# Patient Record
Sex: Male | Born: 1995 | Race: Black or African American | Hispanic: No | Marital: Single | Smoking: Never smoker
Health system: Southern US, Community
[De-identification: ages and names within clinical notes are randomized; demographics above are authoritative.]

---
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
## 2014-10-06 ENCOUNTER — Emergency Department (HOSPITAL_COMMUNITY)
Admission: EM | Admit: 2014-10-06 | Discharge: 2014-10-06 | Disposition: A | Discharge status: Home / Self Care | Payer: 59 | Attending: Emergency Medicine | Admitting: Emergency Medicine

## 2014-10-06 ENCOUNTER — Encounter (HOSPITAL_COMMUNITY): Payer: Self-pay | Admitting: Emergency Medicine

## 2014-10-06 ENCOUNTER — Emergency Department (HOSPITAL_COMMUNITY): Payer: 59

## 2014-10-06 DIAGNOSIS — R42 Dizziness and giddiness: Secondary | ICD-10-CM | POA: Diagnosis not present

## 2014-10-06 DIAGNOSIS — R51 Headache: Secondary | ICD-10-CM | POA: Insufficient documentation

## 2014-10-06 DIAGNOSIS — R519 Headache, unspecified: Secondary | ICD-10-CM

## 2014-10-06 MED ORDER — SODIUM CHLORIDE 0.9 % IV BOLUS (SEPSIS)
1000.0000 mL | Freq: Once | INTRAVENOUS | Status: AC
Start: 2014-10-06 — End: 2014-10-06
  Administered 2014-10-06: 1000 mL via INTRAVENOUS

## 2014-10-06 MED ORDER — DIPHENHYDRAMINE HCL 50 MG/ML IJ SOLN
25.0000 mg | Freq: Once | INTRAMUSCULAR | Status: AC
  Administered 2014-10-06: 25 mg via INTRAVENOUS
  Filled 2014-10-06: qty 1

## 2014-10-06 MED ORDER — METOCLOPRAMIDE HCL 5 MG/ML IJ SOLN
10.0000 mg | Freq: Once | INTRAMUSCULAR | Status: AC
  Administered 2014-10-06: 10 mg via INTRAVENOUS
  Filled 2014-10-06: qty 2

## 2014-10-06 MED ORDER — KETOROLAC TROMETHAMINE 30 MG/ML IJ SOLN
30.0000 mg | Freq: Once | INTRAMUSCULAR | Status: AC
  Administered 2014-10-06: 30 mg via INTRAVENOUS
  Filled 2014-10-06: qty 1

## 2014-10-06 NOTE — ED Notes (Signed)
IV removed from rt ac, 2x2 gauze applied with tape.

## 2014-10-06 NOTE — ED Notes (Signed)
Patient complaining of headache since Sunday.  The headache started in the occipital and has moved to the anterior.  Denies visual and light sensitivity.

## 2014-10-06 NOTE — Discharge Instructions (Signed)

## 2014-10-06 NOTE — ED Notes (Signed)
Family at bedside. 

## 2014-10-06 NOTE — ED Provider Notes (Signed)
CSN: [id number]     Arrival date & time 10/06/14  0907 History   First MD Initiated Contact with Patient 10/06/14 0911     Chief Complaint  Patient presents with  . Headache     (Consider location/radiation/quality/duration/timing/severity/associated sxs/prior Treatment) HPI Comments: Patient presents to emergency department with chief complaint of headache. He states that he has had intermittent headaches for the past 3 years. He states that the headaches are severe. He states that this current headache started on Sunday. He states that it starts slowly and has progressively worsened. States that started in the left occipital region and has moved anteriorly. He denies any vision changes. Denies any weakness or numbness of the extremities. Denies any slurred speech. He states that he has felt dizzy from time to time. He has tried taking OTC medications with no relief. He denies any fevers, chills, or neck stiffness. He reports being able to ambulate appropriately.  Of note, patient has family history of brain cancer.  The history is provided by the patient. No language interpreter was used.    History reviewed. No pertinent past medical history. History reviewed. No pertinent past surgical history. No family history on file. History  Substance Use Topics  . Smoking status: Never Smoker   . Smokeless tobacco: Not on file  . Alcohol Use: No    Review of Systems  Constitutional: Negative for fever and chills.  Respiratory: Negative for shortness of breath.   Cardiovascular: Negative for chest pain.  Gastrointestinal: Negative for nausea, vomiting, diarrhea and constipation.  Genitourinary: Negative for dysuria.  Neurological: Positive for headaches.  All other systems reviewed and are negative.     Allergies  Review of patient's allergies indicates no known allergies.  Home Medications   Prior to Admission medications   Not on File   BP 138/84 mmHg  Pulse 76  Temp(Src) 98  F (36.7 C) (Oral)  Resp 11  Ht 5\' 11"  (1.803 m)  Wt 220 lb (99.791 kg)  BMI 30.70 kg/m2  SpO2 99% Physical Exam  Constitutional: He is oriented to person, place, and time. He appears well-developed and well-nourished.  HENT:  Head: Normocephalic and atraumatic.  Right Ear: External ear normal.  Left Ear: External ear normal.  Eyes: Conjunctivae and EOM are normal. Pupils are equal, round, and reactive to light.  Neck: Normal range of motion. Neck supple.  No pain with neck flexion, no meningismus  Cardiovascular: Normal rate, regular rhythm and normal heart sounds.  Exam reveals no gallop and no friction rub.   No murmur heard. Pulmonary/Chest: Effort normal and breath sounds normal. No respiratory distress. He has no wheezes. He has no rales. He exhibits no tenderness.  Abdominal: Soft. He exhibits no distension and no mass. There is no tenderness. There is no rebound and no guarding.  Musculoskeletal: Normal range of motion. He exhibits no edema or tenderness.  Normal gait.  Neurological: He is alert and oriented to person, place, and time. He has normal reflexes.  CN 3-12 intact, normal finger to nose, no pronator drift, sensation and strength intact bilaterally.  Skin: Skin is warm and dry.  Psychiatric: He has a normal mood and affect. His behavior is normal. Judgment and thought content normal.  Nursing note and vitals reviewed.   ED Course  Procedures (including critical care time) Labs Review Labs Reviewed - No data to display  Imaging Review Ct Head Wo Contrast  10/06/2014   CLINICAL DATA:  Headache.  EXAM: CT HEAD WITHOUT  CONTRAST  TECHNIQUE: Contiguous axial images were obtained from the base of the skull through the vertex without intravenous contrast.  COMPARISON:  None.  FINDINGS: Bony calvarium is intact. No mass effect or midline shift is noted. Ventricular size is within normal limits. There is no evidence of mass lesion, hemorrhage or acute infarction.   IMPRESSION: Normal head CT.   Electronically Signed   By: Roque LiasJames  Green M.D.   On: 10/06/2014 10:35     EKG Interpretation None      MDM   Final diagnoses:  Headache    Patient with three-year history of intermittent headaches. He has never had a workup done. States that one time he is scheduled to have a CT scan because brain cancer runs in his family, however he became nervous never got the test done. States that he would like to have tests done today. I will order a CT scan for this patient as well as treat his symptoms. If CT is negative, will discharge to home and recommend neurology follow-up.  Pt HA treated and improved while in ED.  Presentation is like pts typical HA and non [facility], Meningitis, or temporal arteritis. Pt is afebrile with no focal neuro deficits, nuchal rigidity, or change in vision. Pt is to follow up with neurology to discuss prophylactic medication. Pt verbalizes understanding and is agreeable with plan to dc.      Roxy HorsemanRobert Yanky Vanderburg, PA-C 10/06/14 1053  Joya Gaskinsonald W Wickline, MD 10/06/14 1255

## 2015-09-24 IMAGING — CT CT HEAD W/O CM
1 series · 16 of 30 positions shown, 20 images · non-contrast
Comparison: None.

CLINICAL DATA: Headache.

EXAM:
CT HEAD WITHOUT CONTRAST
TECHNIQUE: Contiguous axial images were obtained from the base of the skull
through the vertex without intravenous contrast.

[Series 2: head 5.0 h30s · axial · 0.45mm/px · z∈[-183,-33]mm · 16 of 34 slices shown, 20 images]
[im 2/34  brain]
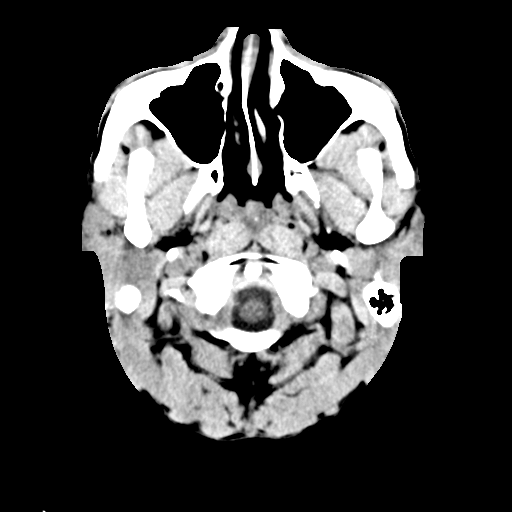
[im 2/34  bone]
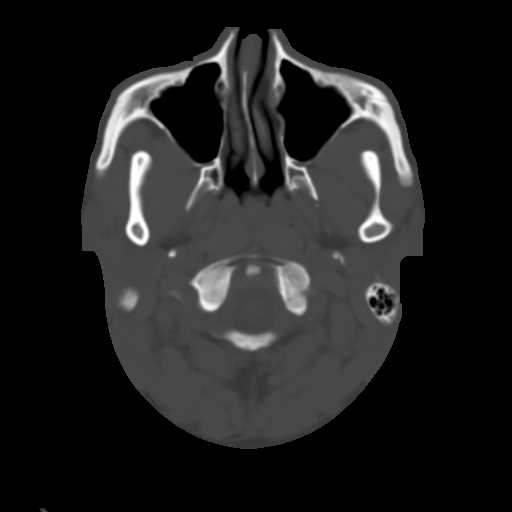
[im 4/34  brain]
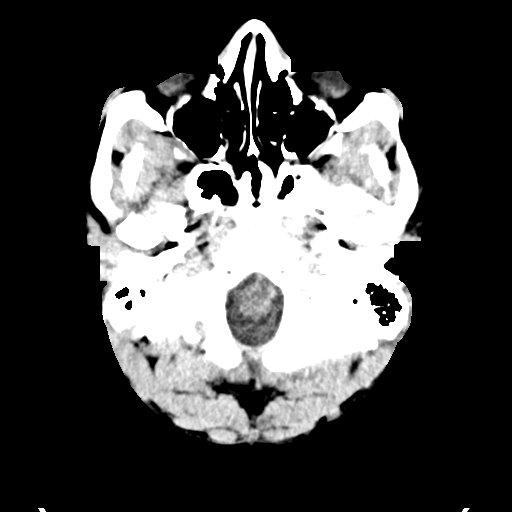
[im 6/34  brain]
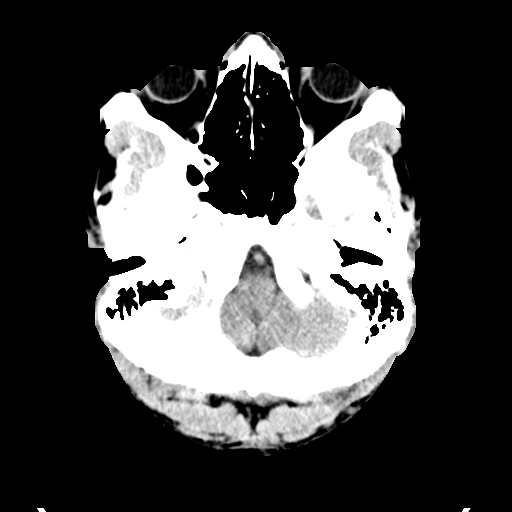
[im 8/34  brain]
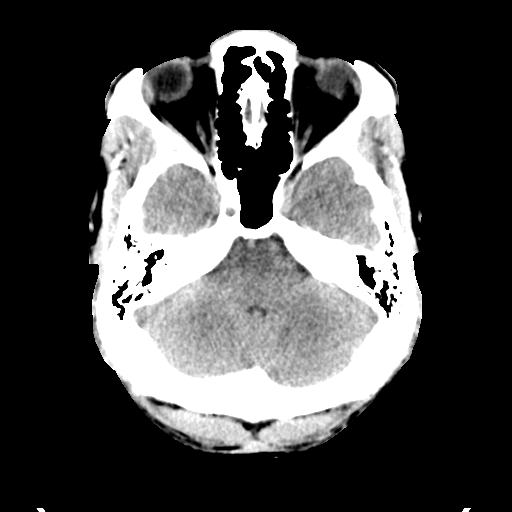
[im 10/34  brain]
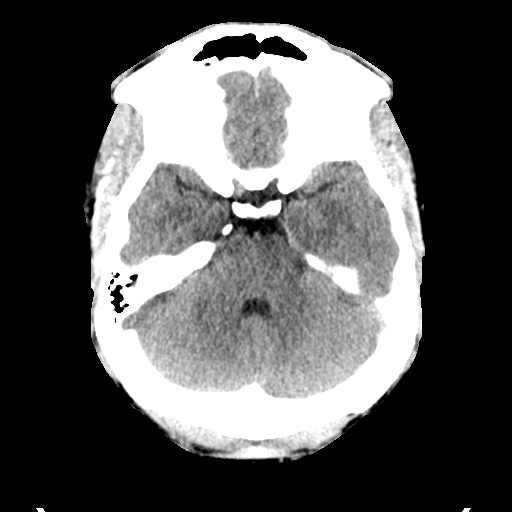
[im 10/34  bone]
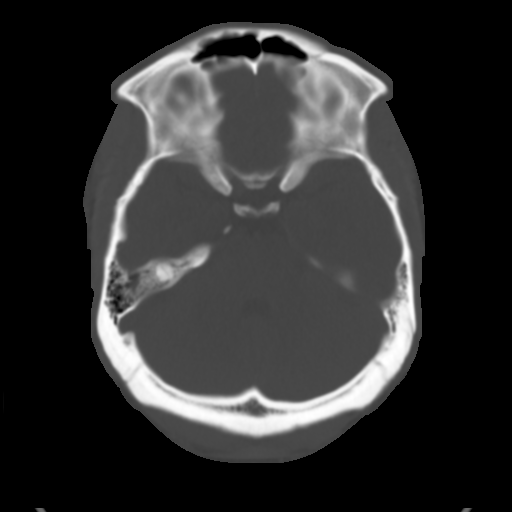
[im 12/34  brain]
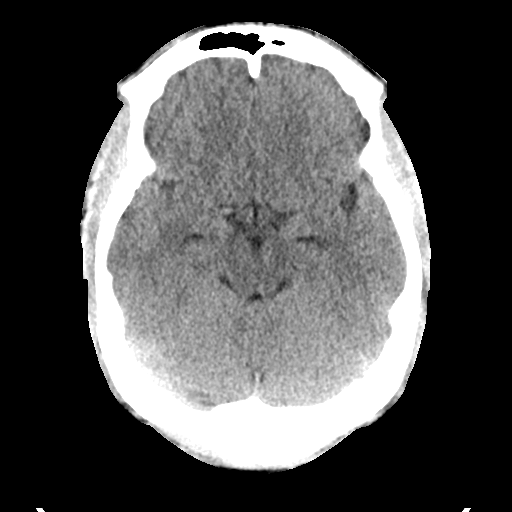
[im 14/34  brain]
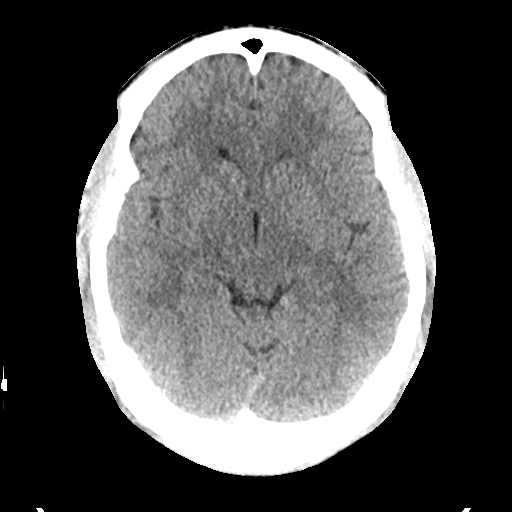
[im 16/34  brain]
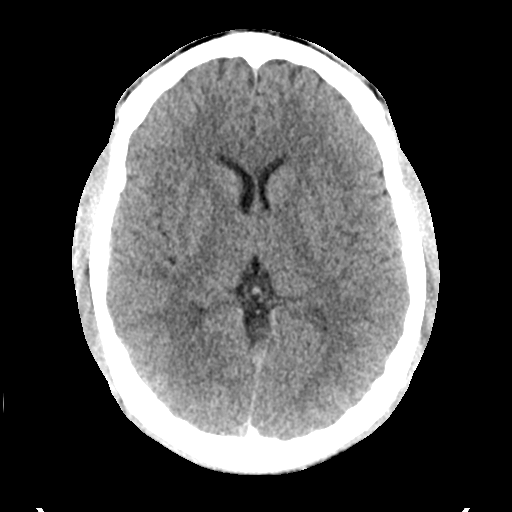
[im 18/34  brain]
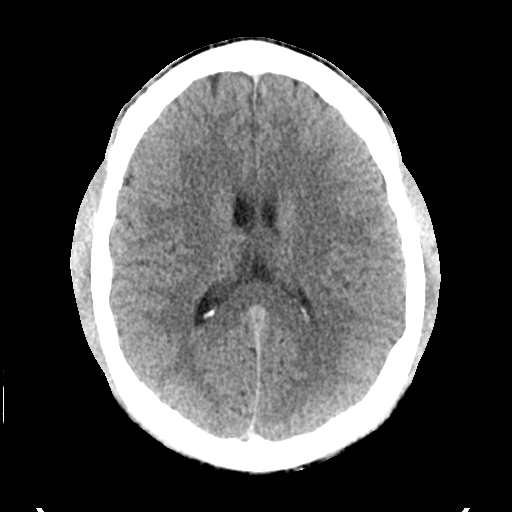
[im 18/34  bone]
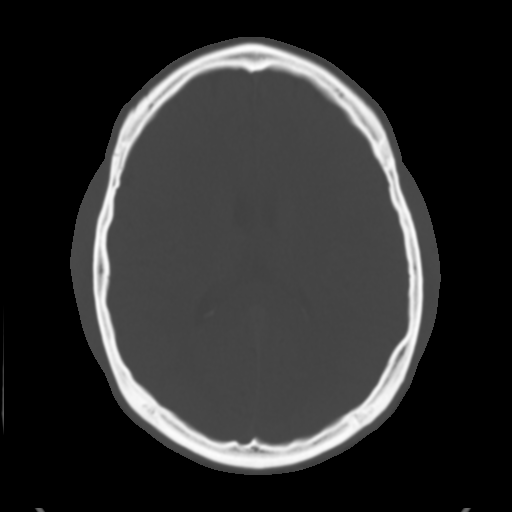
[im 20/34  brain]
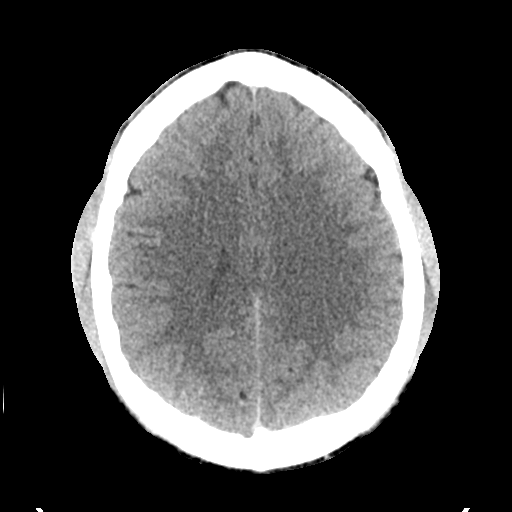
[im 22/34  brain]
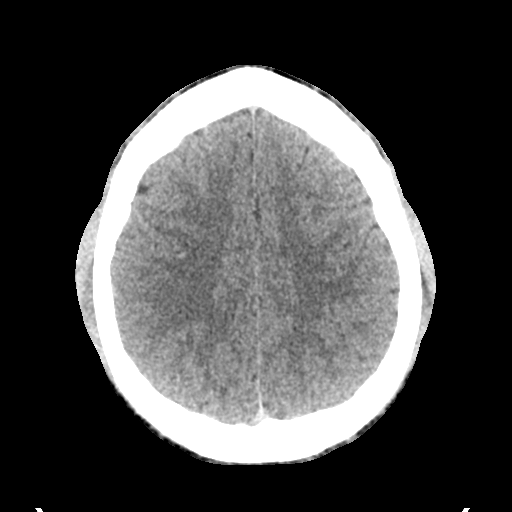
[im 24/34  brain]
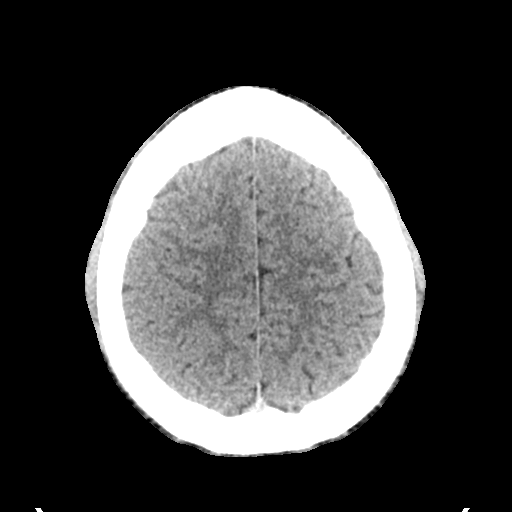
[im 26/34  brain]
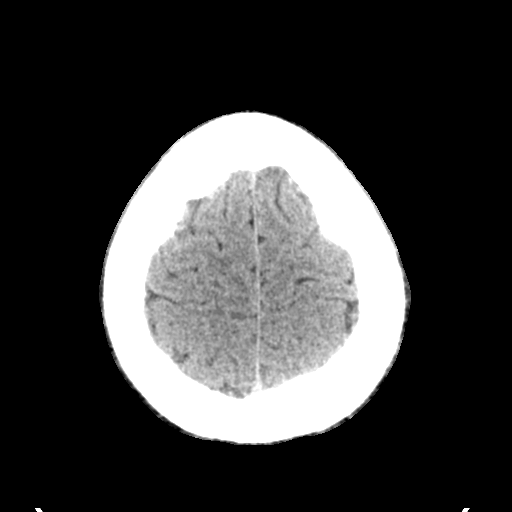
[im 26/34  bone]
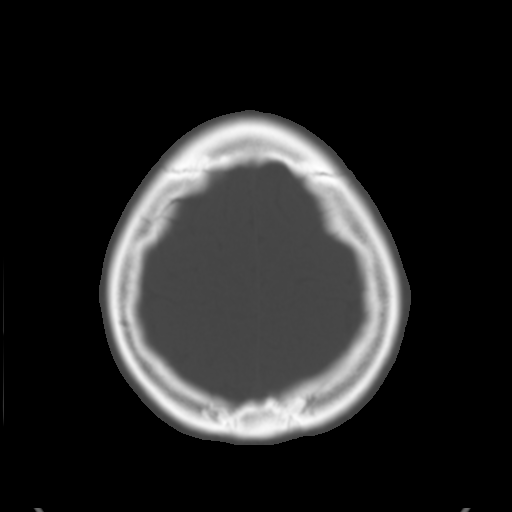
[im 28/34  brain]
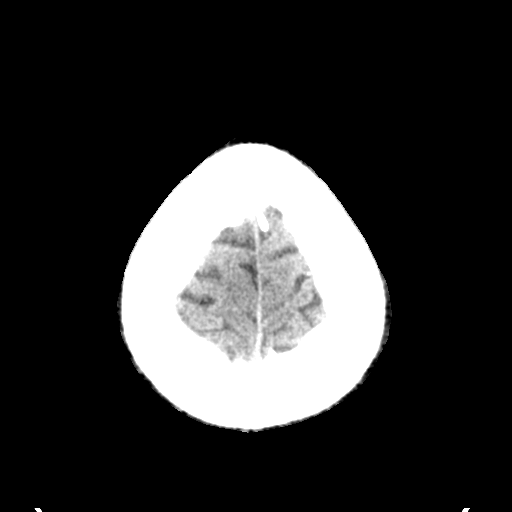
[im 30/34  brain]
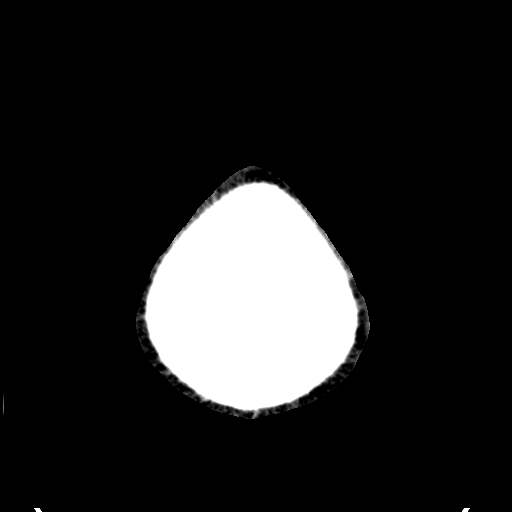
[im 32/34  brain]
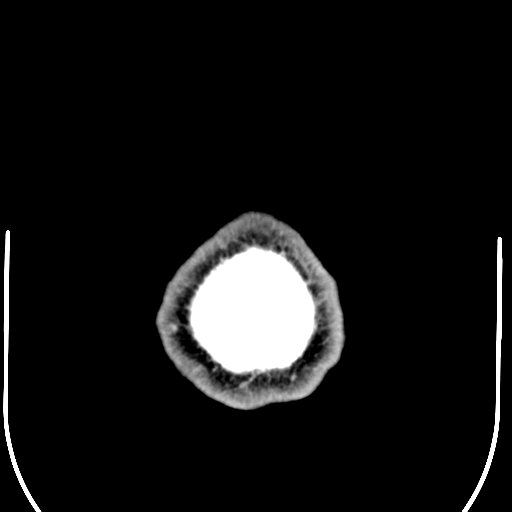

[16 of 30 positions shown; findings below may reference images not displayed]

FINDINGS: Bony calvarium is intact. No mass effect or midline shift is noted.
Ventricular size is within normal limits. There is no evidence of
mass lesion, hemorrhage or acute infarction.
IMPRESSION: Normal head CT.

## 2016-01-15 ENCOUNTER — Emergency Department (HOSPITAL_COMMUNITY): Payer: Medicaid Other

## 2016-01-15 ENCOUNTER — Emergency Department (HOSPITAL_COMMUNITY)
Admission: EM | Admit: 2016-01-15 | Discharge: 2016-01-15 | Disposition: A | Discharge status: Home / Self Care | Payer: Medicaid Other | Attending: Emergency Medicine | Admitting: Emergency Medicine

## 2016-01-15 ENCOUNTER — Encounter (HOSPITAL_COMMUNITY): Payer: Self-pay | Admitting: Emergency Medicine

## 2016-01-15 DIAGNOSIS — L989 Disorder of the skin and subcutaneous tissue, unspecified: Secondary | ICD-10-CM | POA: Diagnosis present

## 2016-01-15 DIAGNOSIS — L03113 Cellulitis of right upper limb: Secondary | ICD-10-CM | POA: Diagnosis not present

## 2016-01-15 DIAGNOSIS — R21 Rash and other nonspecific skin eruption: Secondary | ICD-10-CM

## 2016-01-15 MED ORDER — CEPHALEXIN 500 MG PO CAPS: 500.0000 mg | ORAL_CAPSULE | Freq: Four times a day (QID) | ORAL | Status: DC

## 2016-01-15 MED ORDER — CEPHALEXIN 500 MG PO CAPS
500.0000 mg | ORAL_CAPSULE | Freq: Once | ORAL | Status: AC
  Administered 2016-01-15: 500 mg via ORAL
  Filled 2016-01-15: qty 1

## 2016-01-15 MED ORDER — SULFAMETHOXAZOLE-TRIMETHOPRIM 800-160 MG PO TABS: 1.0000 | ORAL_TABLET | Freq: Two times a day (BID) | ORAL | Status: DC

## 2016-01-15 MED ORDER — SULFAMETHOXAZOLE-TRIMETHOPRIM 800-160 MG PO TABS
1.0000 | ORAL_TABLET | Freq: Once | ORAL | Status: AC
  Administered 2016-01-15: 1 via ORAL
  Filled 2016-01-15: qty 1

## 2016-01-15 NOTE — ED Notes (Signed)
Pt noticed earlier a raised area near his R axilla that was itchy that is now red, raised and spreading. Alert and oriented.

## 2016-01-15 NOTE — Discharge Instructions (Signed)
We think you have cellulitis. Please take the antibiotics. Return to the ER immediately if the rash is spreading, there is increased pain/swelling, pus drainage.   Cellulitis Cellulitis is an infection of the skin and the tissue beneath it. The infected area is usually red and tender. Cellulitis occurs most often in the arms and lower legs.  CAUSES  Cellulitis is caused by bacteria that enter the skin through cracks or cuts in the skin. The most common types of bacteria that cause cellulitis are staphylococci and streptococci. SIGNS AND SYMPTOMS   Redness and warmth.  Swelling.  Tenderness or pain.  Fever. DIAGNOSIS  Your health care provider can usually determine what is wrong based on a physical exam. Blood tests may also be done. TREATMENT  Treatment usually involves taking an antibiotic medicine. HOME CARE INSTRUCTIONS   Take your antibiotic medicine as directed by your health care provider. Finish the antibiotic even if you start to feel better.  Keep the infected arm or leg elevated to reduce swelling.  Apply a warm cloth to the affected area up to 4 times per day to relieve pain.  Take medicines only as directed by your health care provider.  Keep all follow-up visits as directed by your health care provider. SEEK MEDICAL CARE IF:   You notice red streaks coming from the infected area.  Your red area gets larger or turns dark in color.  Your bone or joint underneath the infected area becomes painful after the skin has healed.  Your infection returns in the same area or another area.  You notice a swollen bump in the infected area.  You develop new symptoms.  You have a fever. SEEK IMMEDIATE MEDICAL CARE IF:   You feel very sleepy.  You develop vomiting or diarrhea.  You have a general ill feeling (malaise) with muscle aches and pains.   This information is not intended to replace advice given to you by your health care provider. Make sure you discuss any  questions you have with your health care provider.   Document Released: 07/23/2005 Document Revised: 07/04/2015 Document Reviewed: 12/29/2011 Elsevier Interactive Patient Education [employer]2016 [employer]

## 2016-01-15 NOTE — ED Provider Notes (Signed)
CSN: [id number]     Arrival date & time 01/15/16  0209 History  By signing my name below, I, Tanda RockersMargaux Venter, attest that this documentation has been prepared under the direction and in the presence of Derwood KaplanAnkit Priest Lockridge, MD. Electronically Signed: Tanda RockersMargaux Venter, ED Scribe. 01/15/2016. 4:45 AM.   Chief Complaint  Patient presents with  . Skin Problem   The history is provided by the patient and a relative. No language interpreter was used.     HPI Comments: Micheal Lara is a 20 y.o. male who presents to the Emergency Department complaining of gradual onset, constant, mild, burning sensation underneath right arm that began yesterday. Pt reports that yesterday he felt a bump to his right axilla. A few hours later he began feeling a burning sensation to his right arm and noticed redness to a large portion of the right upper arm. He denies any pain to the area. No other associated symptoms.   History reviewed. No pertinent past medical history. History reviewed. No pertinent past surgical history. History reviewed. No pertinent family history. Social History  Substance Use Topics  . Smoking status: Never Smoker   . Smokeless tobacco: None  . Alcohol Use: No    Review of Systems  A complete 10 system review of systems was obtained and all systems are negative except as noted in the HPI and PMH.   Allergies  Review of patient's allergies indicates no known allergies.  Home Medications   Prior to Admission medications   Medication Sig Start Date End Date Taking? Authorizing Provider  ibuprofen (ADVIL,MOTRIN) 200 MG tablet Take 400-600 mg by mouth every 6 (six) hours as needed for headache or mild pain.   Yes Historical Provider, MD  naproxen sodium (ANAPROX) 220 MG tablet Take 440 mg by mouth 2 (two) times daily as needed (for pain and headache). Reported on 01/15/2016   Yes Historical Provider, MD  cephALEXin (KEFLEX) 500 MG capsule Take 1 capsule (500 mg total) by mouth 4 (four) times  daily. 01/15/16   Derwood KaplanAnkit Parthenia Tellefsen, MD  sulfamethoxazole-trimethoprim (BACTRIM DS,SEPTRA DS) 800-160 MG tablet Take 1 tablet by mouth 2 (two) times daily. 01/15/16   Leanne Sisler, MD   BP 123/72 mmHg  Pulse 88  Temp(Src) 98.1 F (36.7 C) (Oral)  Resp 18  SpO2 97%   Physical Exam  Constitutional: He is oriented to person, place, and time. He appears well-developed and well-nourished. No distress.  HENT:  Head: Normocephalic and atraumatic.  Eyes: Conjunctivae and EOM are normal.  Neck: Neck supple. No tracheal deviation present.  Cardiovascular: Normal rate.   Pulmonary/Chest: Effort normal. No respiratory distress.  Musculoskeletal: Normal range of motion.  Neurological: He is alert and oriented to person, place, and time.  Skin: Skin is warm and dry.  14 x 9 cm area of induration.  Skin is warm to touch.  The erythema extends from the right axilla up to the distal third of the right arm.   Psychiatric: He has a normal mood and affect. His behavior is normal.  Nursing note and vitals reviewed.   ED Course  Procedures (including critical care time)  DIAGNOSTIC STUDIES: Oxygen Saturation is 98% on RA, normal by my interpretation.    COORDINATION OF CARE: 4:44 AM-Discussed treatment plan with pt at bedside and pt agreed to plan.   Labs Review Labs Reviewed - No data to display  Imaging Review No results found. I have personally reviewed and evaluated these images and lab results as part of my  medical decision-making.   EKG Interpretation None      MDM   Final diagnoses:  Rash  Cellulitis of right upper extremity    I personally performed the services described in this documentation, which was scribed in my presence. The recorded information has been reviewed and is accurate.  Pt comes in with cc of rash - noted to have cellulitis. Not immunosupressed. RN advised to encircle the rash. Will start keflex and doxy.  Derwood Kaplan, MD 01/18/16 1845

## 2017-01-02 IMAGING — CR DG HUMERUS 2V *R*
3 series · 3 of 3 positions shown · non-contrast
Comparison: None.

CLINICAL DATA: Possible insect bite.  New rash.

EXAM:
RIGHT HUMERUS - 2+ VIEW

[w humerus ap right]
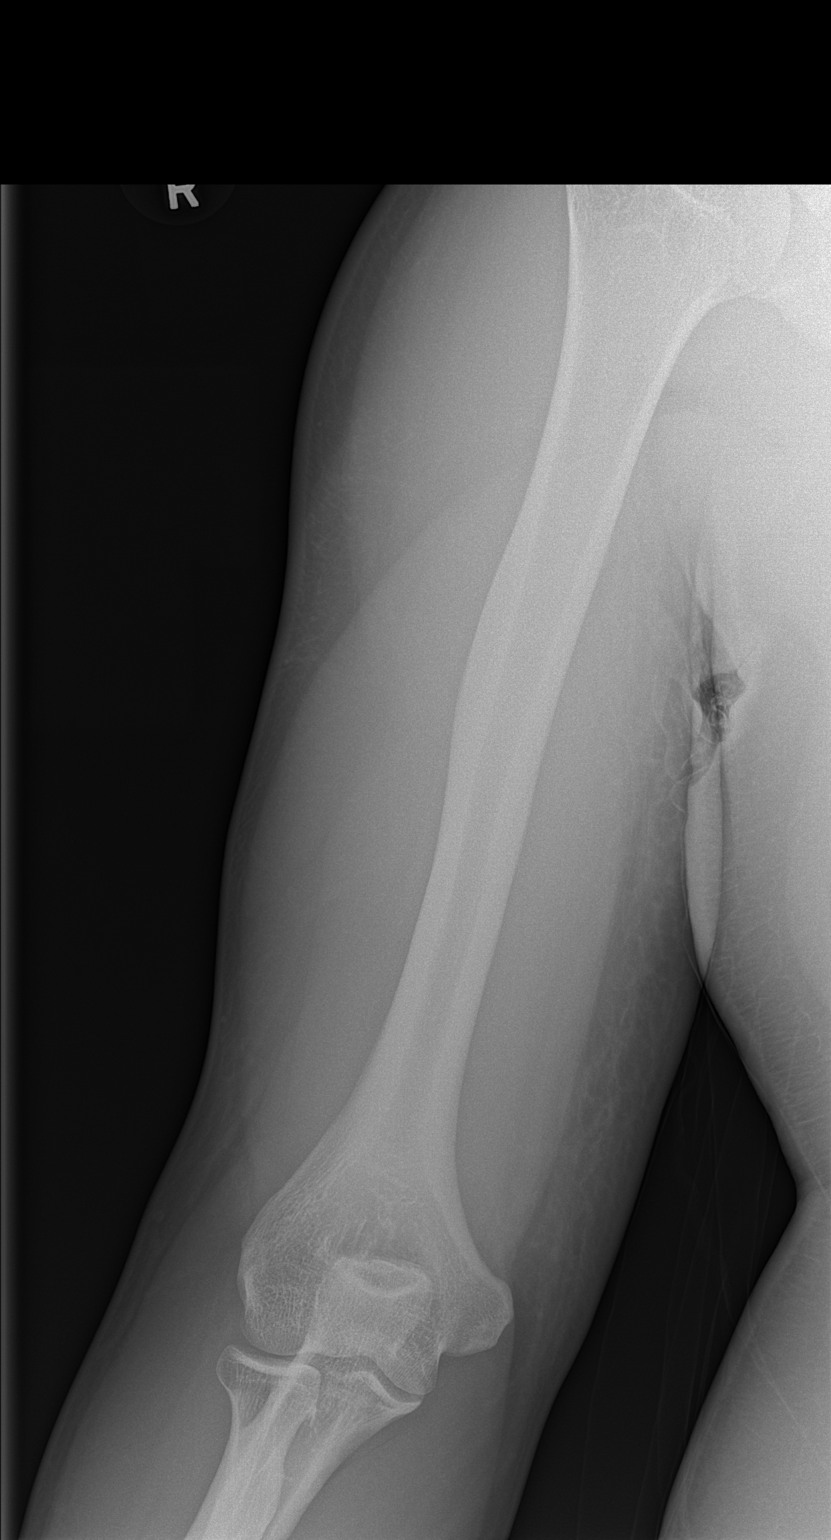

[w humerus lat right (1 of 2)]
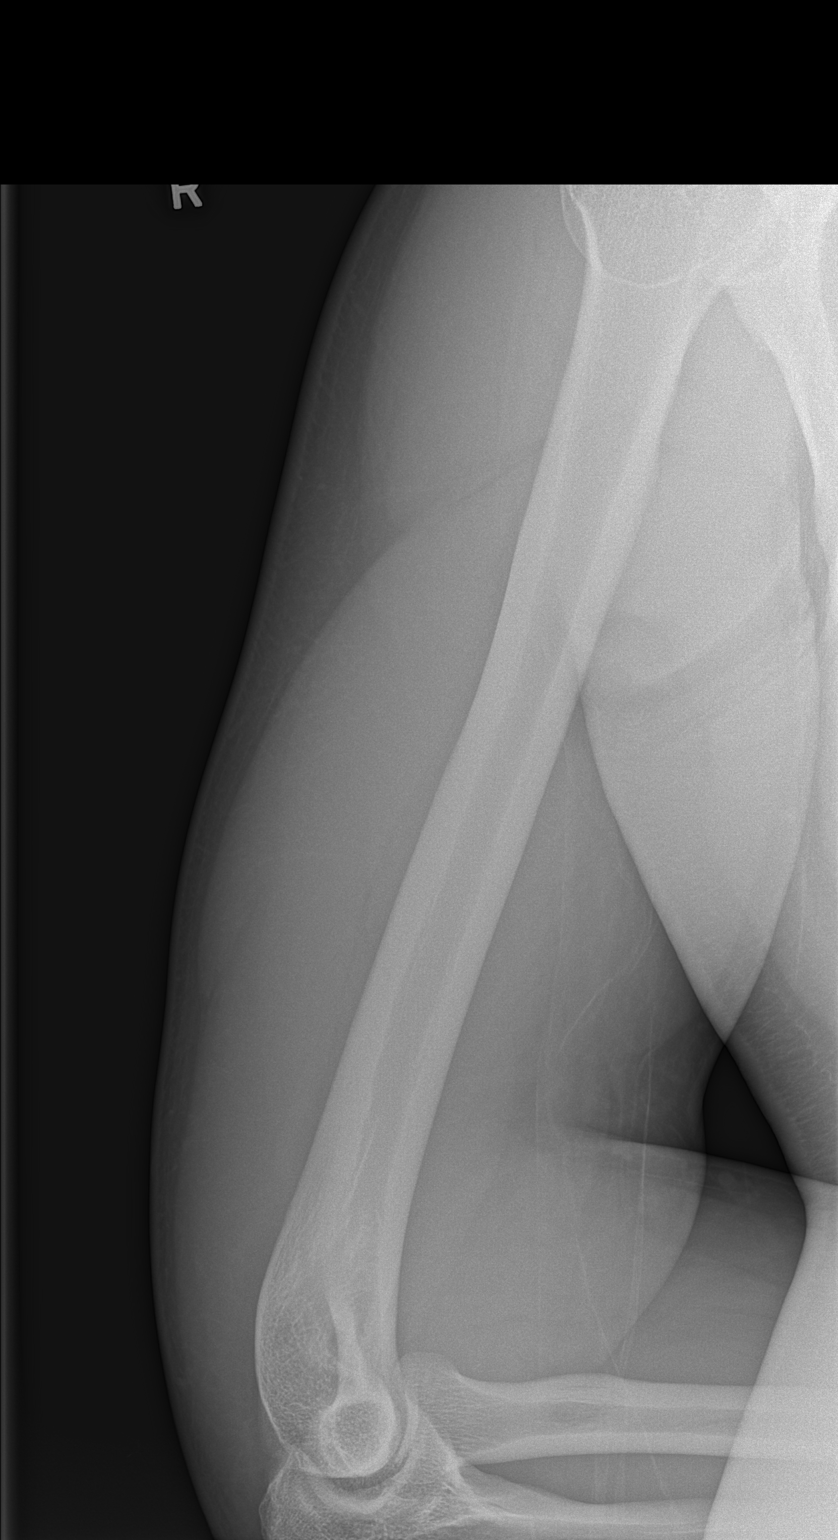

[w humerus lat right (2 of 2)]
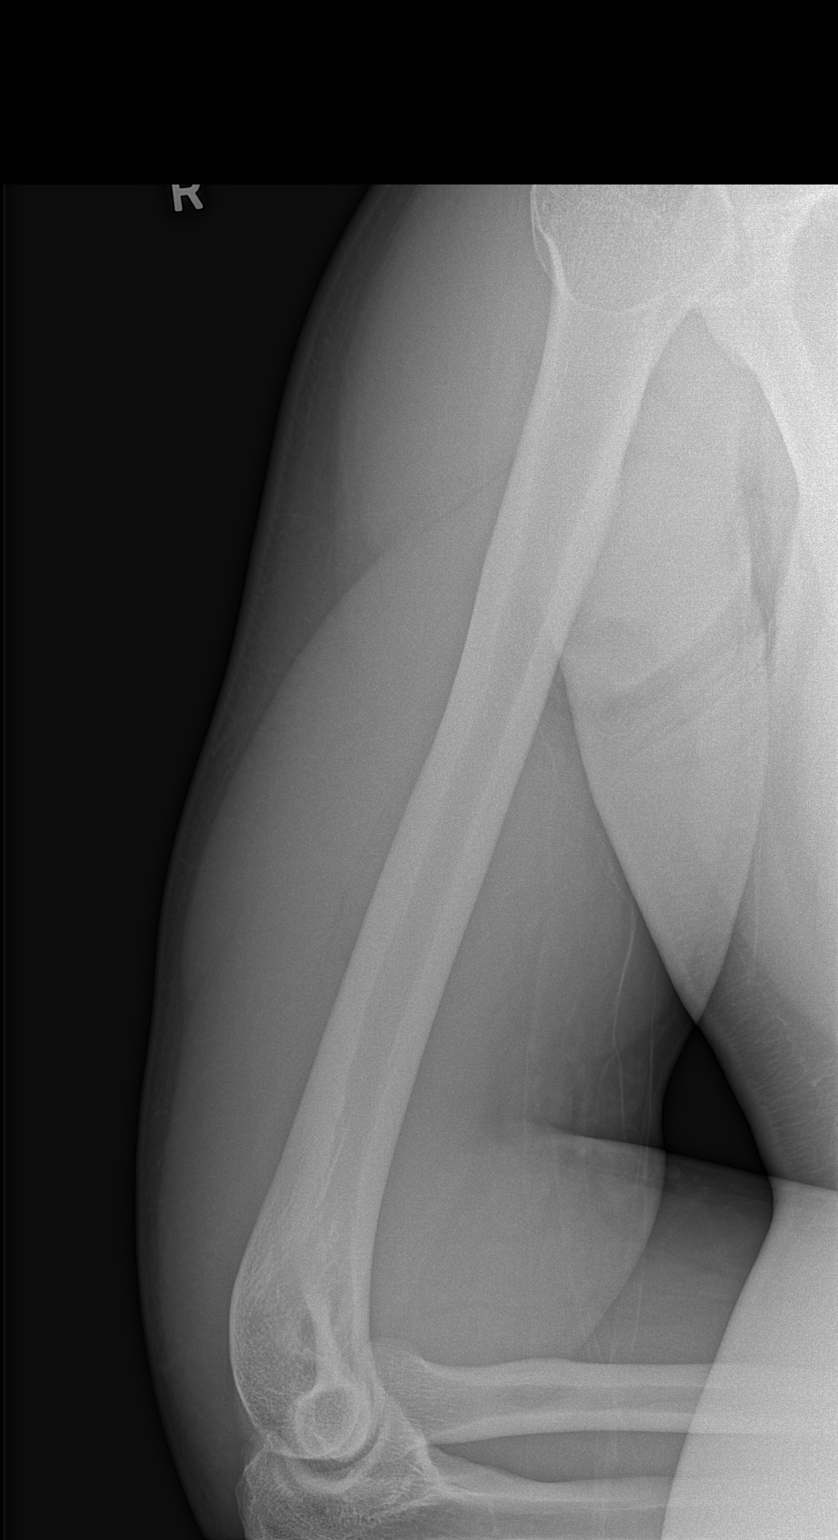

[3 of 3 positions shown; findings below may reference images not displayed]

FINDINGS: Negative for fracture, dislocation or radiopaque foreign body. No
soft tissue gas.
IMPRESSION: Negative.

## 2022-09-26 DIAGNOSIS — Z419 Encounter for procedure for purposes other than remedying health state, unspecified: Secondary | ICD-10-CM | POA: Diagnosis not present

## 2022-10-27 DIAGNOSIS — Z419 Encounter for procedure for purposes other than remedying health state, unspecified: Secondary | ICD-10-CM | POA: Diagnosis not present

## 2023-08-15 ENCOUNTER — Other Ambulatory Visit: Payer: Self-pay

## 2023-08-15 ENCOUNTER — Emergency Department (HOSPITAL_COMMUNITY)
Admission: EM | Admit: 2023-08-15 | Discharge: 2023-08-15 | Disposition: A | Discharge status: Home / Self Care | Payer: Medicaid Other | Attending: Emergency Medicine | Admitting: Emergency Medicine

## 2023-08-15 ENCOUNTER — Encounter (HOSPITAL_COMMUNITY): Payer: Self-pay

## 2023-08-15 ENCOUNTER — Emergency Department (HOSPITAL_BASED_OUTPATIENT_CLINIC_OR_DEPARTMENT_OTHER): Payer: Medicaid Other

## 2023-08-15 DIAGNOSIS — S76302A Unspecified injury of muscle, fascia and tendon of the posterior muscle group at thigh level, left thigh, initial encounter: Secondary | ICD-10-CM | POA: Insufficient documentation

## 2023-08-15 DIAGNOSIS — M79605 Pain in left leg: Secondary | ICD-10-CM | POA: Diagnosis present

## 2023-08-15 DIAGNOSIS — X58XXXA Exposure to other specified factors, initial encounter: Secondary | ICD-10-CM | POA: Insufficient documentation

## 2023-08-15 DIAGNOSIS — M7989 Other specified soft tissue disorders: Secondary | ICD-10-CM

## 2023-08-15 NOTE — Progress Notes (Signed)
VASCULAR LAB    Left lower extremity venous duplex has been performed.  See CV proc for preliminary results.  Messaged negative results to Dr. Lavonna Rua via secure chat.   Chevez Sambrano, RVT 08/15/2023, 12:06 PM

## 2023-08-15 NOTE — ED Provider Notes (Signed)
EMERGENCY DEPARTMENT AT [facility] Provider Note   CSN: [id number] Arrival date & time: 08/15/23  1019     History  Chief Complaint  Patient presents with  . Leg Pain    Micheal Lara is a 27 y.o. male.  Patient is a 27 year old male with no significant past medical history presenting to the emergency department with leg pain and swelling.  Patient states over the last few days his left leg has felt sore.  He states that he noticed this morning that it was swollen and he was having pain with ambulating which prompted him to come to the emergency department.  He states that his leg has felt warm but has not seen any redness or rash.  He denies any fevers.  He denies any history of blood clots, recent hospitalization or surgery, recent long travel in the car or plane, hormone use or cancer history.  He denies any trauma or falls.  He denies any numbness or weakness.  The history is provided by the patient.  Leg Pain      Home Medications Prior to Admission medications   Medication Sig Start Date End Date Taking? Authorizing Provider  cephALEXin (KEFLEX) 500 MG capsule Take 1 capsule (500 mg total) by mouth 4 (four) times daily. 01/15/16   Derwood Kaplan, MD  ibuprofen (ADVIL,MOTRIN) 200 MG tablet Take 400-600 mg by mouth every 6 (six) hours as needed for headache or mild pain.    [provider]  naproxen sodium (ANAPROX) 220 MG tablet Take 440 mg by mouth 2 (two) times daily as needed (for pain and headache). Reported on 01/15/2016    [provider]  sulfamethoxazole-trimethoprim (BACTRIM DS,SEPTRA DS) 800-160 MG tablet Take 1 tablet by mouth 2 (two) times daily. 01/15/16   Derwood Kaplan, MD      Allergies    Patient has no known allergies.    Review of Systems   Review of Systems  Physical Exam Updated Vital Signs BP 129/78 (BP Location: Right Arm)   Pulse (!) 104   Temp 98.7 F (37.1 C) (Oral)   Resp 17   Ht 5\' 11"  (1.803 m)    Wt 99.8 kg   SpO2 97%   BMI 30.69 kg/m  Physical Exam Vitals and nursing note reviewed.  Constitutional:      General: He is not in acute distress.    Appearance: Normal appearance.  HENT:     Head: Normocephalic.     Nose: Nose normal.     Mouth/Throat:     Mouth: Mucous membranes are moist.  Eyes:     Extraocular Movements: Extraocular movements intact.  Cardiovascular:     Rate and Rhythm: Normal rate.     Pulses: Normal pulses.  Pulmonary:     Effort: Pulmonary effort is normal.  Musculoskeletal:        General: No tenderness. Normal range of motion.     Cervical back: Normal range of motion.  Skin:    General: Skin is warm and dry.  Neurological:     General: No focal deficit present.     Mental Status: He is alert and oriented to person, place, and time.     Sensory: No sensory deficit.     Motor: No weakness.     Gait: Gait normal.     ED Results / Procedures / Treatments   Labs (all labs ordered are listed, but only abnormal results are displayed) Labs Reviewed - No data to  display  EKG None  Radiology No results found.  Procedures Procedures    Medications Ordered in ED Medications - No data to display  ED Course/ Medical Decision Making/ A&P Clinical Course as of 08/15/23 1244  Sat Aug 15, 2023  1118 After patient changed, found to have mild swelling and tenderness to L posterior thigh, no overlying skin changes, no pitting edema. [place] is pending. Declined pain medication. [VK]  1206 DVT US negative. Suspect likely hamstring strain or spasm. Will be recommended stretches and outpatient follow up. [VK]  1243 Patient febrile on reassessment. No signs of cellulitis on exam, patient denies any other infectious symptoms, states he would prefer to be discharged home and take Tylenol at home. [VK]    Clinical Course User Index [VK] Rexford Maus, DO                                 Medical Decision Making This patient presents to the ED with  chief complaint(s) of LLE pain/swelling with no pertinent past medical history which further complicates the presenting complaint. The complaint involves an extensive differential diagnosis and also carries with it a high risk of complications and morbidity.    The differential diagnosis includes muscle strain or spasm, no bony tenderness making fracture dislocation unlikely, considering DVT, cellulitis  Additional history obtained: Additional history obtained from N/A Records reviewed N/A  ED Course and Reassessment: On patient's arrival he is hemodynamically stable in no acute distress, neurovascularly intact.  Initially had pants on in the hallway and was given gown to change of his leg examined however no palpable abnormalities.  Will have DVT ultrasound performed.  Independent labs interpretation:  N/A  Independent visualization of imaging: - I independently visualized the following imaging with scope of interpretation limited to determining acute life threatening conditions related to emergency care: LLE [place], which revealed no DVT  Consultation: - Consulted or discussed management/test interpretation w/ external professional: N/A  Consideration for admission or further workup: Patient has no emergent conditions requiring admission or further work-up at this time and is stable for discharge home with primary care follow-up  Social Determinants of health: N/A            Final Clinical Impression(s) / ED Diagnoses Final diagnoses:  Hamstring injury, left, initial encounter    Rx / DC Orders ED Discharge Orders     None         Rexford Maus, DO 08/15/23 1222

## 2023-08-15 NOTE — Discharge Instructions (Signed)
You were seen in the emergency department for your pain in the back of your thigh.  Your ultrasound was negative for blood clots and you likely have strained the muscle in the back of your thigh or have a slight muscle spasm.  You can continue to take Tylenol or Motrin every 6 hours as needed for pain and you should stretch her leg.  You can follow-up with your primary doctor in the next few days to have your symptoms rechecked.  You should return to the emergency department if your leg becomes swollen, you have significantly increased pain and you are unable to walk, you have spreading redness in your leg or fevers or any other new or concerning symptoms.

## 2023-08-15 NOTE — ED Triage Notes (Signed)
Pt c/o left leg painx1wk. Pt denies injury. Pt limped to triage. Pt has 3+ swelling of left upper leg.

## 2023-08-21 ENCOUNTER — Encounter (HOSPITAL_COMMUNITY): Payer: Self-pay

## 2023-08-21 ENCOUNTER — Other Ambulatory Visit: Payer: Self-pay

## 2023-08-21 ENCOUNTER — Emergency Department (HOSPITAL_COMMUNITY)
Admission: EM | Admit: 2023-08-21 | Discharge: 2023-08-21 | Disposition: A | Discharge status: Home / Self Care | Payer: Medicaid Other | Attending: Emergency Medicine | Admitting: Emergency Medicine

## 2023-08-21 ENCOUNTER — Emergency Department (HOSPITAL_COMMUNITY): Payer: Medicaid Other

## 2023-08-21 DIAGNOSIS — M79652 Pain in left thigh: Secondary | ICD-10-CM | POA: Insufficient documentation

## 2023-08-21 LAB — CBC
HCT: 36.1 % — ABNORMAL LOW (ref 39.0–52.0)
Hemoglobin: 11.8 g/dL — ABNORMAL LOW (ref 13.0–17.0)
MCH: 29.7 pg (ref 26.0–34.0)
MCHC: 32.7 g/dL (ref 30.0–36.0)
MCV: 90.9 fL (ref 80.0–100.0)
Platelets: 108 10*3/uL — ABNORMAL LOW (ref 150–400)
RBC: 3.97 MIL/uL — ABNORMAL LOW (ref 4.22–5.81)
RDW: 13.1 % (ref 11.5–15.5)
WBC: 5.3 10*3/uL (ref 4.0–10.5)
nRBC: 0 % (ref 0.0–0.2)

## 2023-08-21 MED ORDER — ACETAMINOPHEN 325 MG PO TABS
650.0000 mg | ORAL_TABLET | Freq: Once | ORAL | Status: AC
  Administered 2023-08-21: 650 mg via ORAL
  Filled 2023-08-21: qty 2

## 2023-08-21 MED ORDER — NAPROXEN 375 MG PO TABS: 375.0000 mg | ORAL_TABLET | Freq: Two times a day (BID) | ORAL | 0 refills | Status: DC

## 2023-08-21 NOTE — Discharge Instructions (Addendum)
Take the medications to help with pain and inflammation.  Follow-up with an orthopedic doctor for further evaluation of the thigh swelling as we discussed.  Return to the ED for fevers chills or other concerning symptoms

## 2023-08-21 NOTE — ED Triage Notes (Signed)
Patient reports left leg pain x 3 weeks. Seen the 19th for same. Had [place] that was negative. Reports pain has progressively worsened.

## 2023-08-21 NOTE — ED Provider Notes (Signed)
[address] EMERGENCY DEPARTMENT AT [facility] Provider Note   CSN: [id number] Arrival date & time: 08/21/23  4098     History  Chief Complaint  Patient presents with   Leg Pain    Micheal Lara is a 27 y.o. male.   Leg Pain    Patient presents to the ED for evaluation of thigh pain.  Patient states the symptoms have been ongoing now for over a week.  He does not recall any specific fall or injury.  He noticed that he started having swelling and discomfort in the back of his left thigh.  It increased when he was ambulating.  Patient states he went to the emergency room on October 19.  He had an ultrasound that did not show any evidence of DVT.  Patient was told he most likely had a hamstring injury.  Patient states his pain has not really gotten any better.  He has been doing some exercises that he reviewed on the Internet.  It continues to feel swollen and tender.  He denies have any fevers or chills.  He denies any back pain.  Home Medications Prior to Admission medications   Medication Sig Start Date End Date Taking? Authorizing Provider  naproxen (NAPROSYN) 375 MG tablet Take 1 tablet (375 mg total) by mouth 2 (two) times daily. 08/21/23  Yes Linwood Dibbles, MD      Allergies    Patient has no known allergies.    Review of Systems   Review of Systems  Physical Exam Updated Vital Signs BP 122/85 (BP Location: Right Arm)   Pulse (!) 106   Temp 99.7 F (37.6 C) (Oral)   Resp 16   Ht 1.803 m (5\' 11" )   Wt 99.8 kg   SpO2 100%   BMI 30.69 kg/m  Physical Exam Vitals and nursing note reviewed.  Constitutional:      General: He is not in acute distress.    Appearance: He is well-developed.  HENT:     Head: Normocephalic and atraumatic.     Right Ear: External ear normal.     Left Ear: External ear normal.  Eyes:     General: No scleral icterus.       Right eye: No discharge.        Left eye: No discharge.     Conjunctiva/sclera: Conjunctivae normal.   Neck:     Trachea: No tracheal deviation.  Cardiovascular:     Rate and Rhythm: Normal rate.  Pulmonary:     Effort: Pulmonary effort is normal. No respiratory distress.     Breath sounds: No stridor.  Abdominal:     General: There is no distension.  Musculoskeletal:        General: Swelling and tenderness present. No deformity.     Cervical back: Neck supple.     Comments: Tenderness palpation left mid hamstring, noted in comparison to the right side, no edema noted inferiorly,   Skin:    General: Skin is warm and dry.     Findings: No rash.  Neurological:     Mental Status: He is alert. Mental status is at baseline.     Cranial Nerves: No dysarthria or facial asymmetry.     Sensory: No sensory deficit.     Motor: No weakness or seizure activity.     Comments: Patient able to stand without difficulty, no sensory deficits     ED Results / Procedures / Treatments   Labs (all labs ordered are  listed, but only abnormal results are displayed) Labs Reviewed  CBC - Abnormal; Notable for the following components:      Result Value   RBC 3.97 (*)    Hemoglobin 11.8 (*)    HCT 36.1 (*)    Platelets 108 (*)    All other components within normal limits    EKG None  Radiology DG Femur Min 2 Views Left  Result Date: 08/21/2023 CLINICAL DATA:  Pain EXAM: LEFT FEMUR 2 VIEWS COMPARISON:  None Available. FINDINGS: There is no evidence of fracture or other focal bone lesions. Soft tissues are unremarkable. IMPRESSION: No acute osseous abnormality Electronically Signed   By: Karen Kays M.D.   On: 08/21/2023 10:44    Procedures Procedures    Medications Ordered in ED Medications  acetaminophen (TYLENOL) tablet 650 mg (650 mg Oral Given 08/21/23 1024)    ED Course/ Medical Decision Making/ A&P Clinical Course as of 08/21/23 1108  Fri Aug 21, 2023  1043 CBC with mild decrease in hemoglobin.  Platelets slightly decreased [JK]  1104 No acute abnormalities noted on plain films  [JK]    Clinical Course User Index [JK] Linwood Dibbles, MD                                 Medical Decision Making Problems Addressed: Pain of left thigh: acute illness or injury that poses a threat to life or bodily functions  Amount and/or Complexity of Data Reviewed Labs: ordered. Decision-making details documented in ED Course. Radiology: ordered and independent interpretation performed.  Risk OTC drugs. Prescription drug management.   Patient presented to the ED with complaints of persistent thigh pain.  Patient evaluated 6 days ago and had a negative Doppler study.  Patient continues to have thigh swelling and tenderness.  He does not recall any specific injury she is unusual for hamstring injury.  He does not have any evidence of infection on exam.  Labs do show slight decrease in his platelet count but I do not think this would cause spontaneous bleeding.  Will have patient follow-up with orthopedics.  I did offer CT scan imaging of his thigh to rule out any mass, patient declines at this time.  With patient's mild decrease in hemoglobin and platelets will have him follow-up with her primary care doctor to have that rechecked.  Warning signs precautions discussed        Final Clinical Impression(s) / ED Diagnoses Final diagnoses:  Pain of left thigh    Rx / DC Orders ED Discharge Orders          Ordered    naproxen (NAPROSYN) 375 MG tablet  2 times daily        08/21/23 1105              Linwood Dibbles, MD 08/21/23 1108

## 2023-09-06 ENCOUNTER — Other Ambulatory Visit: Payer: Self-pay

## 2023-09-06 ENCOUNTER — Encounter (HOSPITAL_COMMUNITY): Payer: Self-pay

## 2023-09-06 ENCOUNTER — Emergency Department (HOSPITAL_COMMUNITY)
Admission: EM | Admit: 2023-09-06 | Discharge: 2023-09-06 | Discharge status: Against Medical Advice | Payer: Medicaid Other | Attending: Emergency Medicine | Admitting: Emergency Medicine

## 2023-09-06 ENCOUNTER — Ambulatory Visit (HOSPITAL_COMMUNITY)
Admission: EM | Admit: 2023-09-06 | Discharge: 2023-09-06 | Disposition: A | Discharge status: Home / Self Care | Payer: Medicaid Other

## 2023-09-06 DIAGNOSIS — M79605 Pain in left leg: Secondary | ICD-10-CM | POA: Insufficient documentation

## 2023-09-06 DIAGNOSIS — Z5321 Procedure and treatment not carried out due to patient leaving prior to being seen by health care provider: Secondary | ICD-10-CM | POA: Diagnosis not present

## 2023-09-06 NOTE — ED Provider Notes (Signed)
Patient presents to clinic for complaints of left upper posterior thigh pain that has been present for the past 6 weeks.  The pain has been gradually worsening and got much worse over the past few days.  He has been seen twice at local emergency departments.  He did have a negative DVT study.  X-Umeda did not show any acute osseous abnormalities.  He was offered a CT scan at the last ED visit but declined.   Reports the masslike area has grown in size and in pain. Area is firm and tender, without erythema or drainage.  He is using a cane to ambulate since his last ED visit.  He is getting winded easily now, denies any shortness of breath or cough.  Discussed due to the size and worsening of pain that he should head to [facility] via POV for further evaluation and advanced imaging.  Patient agreeable to plan, will head to [facility].    Micheal Lara, [place], [place] 09/06/23 1236

## 2023-09-06 NOTE — ED Notes (Signed)
Patient is being discharged from the Urgent Care and sent to the Emergency Department via self . Per provider, patient is in need of higher level of care due to left leg pain. Patient is aware and verbalizes understanding of plan of care.  Vitals:   09/06/23 1213  BP: 124/78  Pulse: (!) 103  Resp: 16  Temp: 98.3 F (36.8 C)  SpO2: 99%

## 2023-09-06 NOTE — ED Triage Notes (Signed)
Patient here today with c/o posterior left upper leg pain X 6 weeks. No known injury. Pain has been worsening. He has tried using Ice and compression. Went to ED at [facility] and [facility] a couple weeks ago and had imaging done. Just not getting any better. He states that he has some swelling.

## 2023-09-06 NOTE — ED Notes (Signed)
Called pt x3 no response.  

## 2023-09-06 NOTE — ED Triage Notes (Addendum)
Patient reports left posterior leg pain x 6 weeks.  Can't remember what caused the hamstring injury but has been progressively getting worse.  Reports left leg is swollen.  Was seen at [facility] and [facility] a couple weeks ago but not getting better. [place] exhibited enlarged lymph nodes in groin.

## 2023-09-15 ENCOUNTER — Emergency Department (HOSPITAL_COMMUNITY)
Admission: EM | Admit: 2023-09-15 | Discharge: 2023-09-15 | Disposition: A | Discharge status: Home / Self Care | Payer: Medicaid Other | Attending: Emergency Medicine | Admitting: Emergency Medicine

## 2023-09-15 ENCOUNTER — Emergency Department (HOSPITAL_COMMUNITY): Payer: Medicaid Other

## 2023-09-15 ENCOUNTER — Other Ambulatory Visit: Payer: Self-pay

## 2023-09-15 DIAGNOSIS — M79652 Pain in left thigh: Secondary | ICD-10-CM | POA: Insufficient documentation

## 2023-09-15 DIAGNOSIS — S8012XD Contusion of left lower leg, subsequent encounter: Secondary | ICD-10-CM

## 2023-09-15 LAB — COMPREHENSIVE METABOLIC PANEL
ALT: 88 U/L — ABNORMAL HIGH (ref 0–44)
AST: 101 U/L — ABNORMAL HIGH (ref 15–41)
Albumin: 2.8 g/dL — ABNORMAL LOW (ref 3.5–5.0)
Alkaline Phosphatase: 92 U/L (ref 38–126)
Anion gap: 8 (ref 5–15)
BUN: 10 mg/dL (ref 6–20)
CO2: 27 mmol/L (ref 22–32)
Calcium: 8.3 mg/dL — ABNORMAL LOW (ref 8.9–10.3)
Chloride: 95 mmol/L — ABNORMAL LOW (ref 98–111)
Creatinine, Ser: 0.69 mg/dL (ref 0.61–1.24)
GFR, Estimated: >60 mL/min (ref >60)
Glucose, Bld: 95 mg/dL (ref 70–99)
Potassium: 4.3 mmol/L (ref 3.5–5.1)
Sodium: 130 mmol/L — ABNORMAL LOW (ref 135–145)
Total Bilirubin: 0.3 mg/dL (ref <1.2)
Total Protein: 8.4 g/dL — ABNORMAL HIGH (ref 6.5–8.1)

## 2023-09-15 LAB — CBC WITH DIFFERENTIAL/PLATELET
Abs Immature Granulocytes: 0.05 10*3/uL (ref 0.00–0.07)
Basophils Absolute: 0 10*3/uL (ref 0.0–0.1)
Basophils Relative: 0 %
Eosinophils Absolute: 0 10*3/uL (ref 0.0–0.5)
Eosinophils Relative: 0 %
HCT: 31 % — ABNORMAL LOW (ref 39.0–52.0)
Hemoglobin: 10.6 g/dL — ABNORMAL LOW (ref 13.0–17.0)
Immature Granulocytes: 1 %
Lymphocytes Relative: 22 %
Lymphs Abs: 1 10*3/uL (ref 0.7–4.0)
MCH: 30.5 pg (ref 26.0–34.0)
MCHC: 34.2 g/dL (ref 30.0–36.0)
MCV: 89.1 fL (ref 80.0–100.0)
Monocytes Absolute: 0.5 10*3/uL (ref 0.1–1.0)
Monocytes Relative: 11 %
Neutro Abs: 3.1 10*3/uL (ref 1.7–7.7)
Neutrophils Relative %: 66 %
Platelets: 221 10*3/uL (ref 150–400)
RBC: 3.48 MIL/uL — ABNORMAL LOW (ref 4.22–5.81)
RDW: 13.3 % (ref 11.5–15.5)
WBC: 4.7 10*3/uL (ref 4.0–10.5)
nRBC: 0 % (ref 0.0–0.2)

## 2023-09-15 MED ORDER — KETOROLAC TROMETHAMINE 15 MG/ML IJ SOLN
15.0000 mg | Freq: Once | INTRAMUSCULAR | Status: DC
  Filled 2023-09-15: qty 1

## 2023-09-15 MED ORDER — OXYCODONE HCL 5 MG PO TABS
5.0000 mg | ORAL_TABLET | Freq: Once | ORAL | Status: AC
  Administered 2023-09-15: 5 mg via ORAL
  Filled 2023-09-15: qty 1

## 2023-09-15 MED ORDER — IOHEXOL 300 MG/ML  SOLN
100.0000 mL | Freq: Once | INTRAMUSCULAR | Status: AC | PRN
  Administered 2023-09-15: 100 mL via INTRAVENOUS

## 2023-09-15 MED ORDER — OXYCODONE-ACETAMINOPHEN 5-325 MG PO TABS: 1.0000 | ORAL_TABLET | Freq: Four times a day (QID) | ORAL | 0 refills | Status: AC | PRN

## 2023-09-15 MED ORDER — KETOROLAC TROMETHAMINE 15 MG/ML IJ SOLN
15.0000 mg | Freq: Once | INTRAMUSCULAR | Status: AC
  Administered 2023-09-15: 15 mg via INTRAVENOUS

## 2023-09-15 MED ORDER — LIDOCAINE 5 % EX PTCH
1.0000 | MEDICATED_PATCH | CUTANEOUS | Status: DC
  Administered 2023-09-15: 1 via TRANSDERMAL
  Filled 2023-09-15: qty 1

## 2023-09-15 NOTE — ED Provider Notes (Signed)
Physical Exam  BP 127/74 (BP Location: Left Arm)   Pulse (!) 108   Temp 98.9 F (37.2 C) (Oral)   Resp 18   SpO2 97%   Physical Exam Vitals and nursing note reviewed.  Constitutional:      General: He is not in acute distress.    Appearance: He is well-developed.  HENT:     Head: Normocephalic and atraumatic.  Eyes:     Conjunctiva/sclera: Conjunctivae normal.  Cardiovascular:     Rate and Rhythm: Normal rate and regular rhythm.     Heart sounds: No murmur heard. Pulmonary:     Effort: Pulmonary effort is normal. No respiratory distress.     Breath sounds: Normal breath sounds.  Abdominal:     Palpations: Abdomen is soft.     Tenderness: There is no abdominal tenderness.  Musculoskeletal:        General: No swelling.     Cervical back: Neck supple.     Comments: Firmness to patient posterior left thigh however no obvious fluctuance, induration or abscess needed drainage.  There is no overlying skin change.  Skin:    General: Skin is warm and dry.     Capillary Refill: Capillary refill takes less than 2 seconds.  Neurological:     Mental Status: He is alert.  Psychiatric:        Mood and Affect: Mood normal.     Procedures  Procedures  ED Course / MDM   Clinical Course as of 09/15/23 1910  Tue Sep 15, 2023  1245 At bedside, VS HR 98bpm, RR 16, SpO2 99% RA, T 98.82F. Reports continued pain and would like lidocaine patch to put on posterior leg [LB]  1509 Seen on 10/19 for hamstring injury with no known cause. Xray negative, DVT negative. Still complaining of left mass TTP. Did not CT. CT looks like mass. No fluctuance. If it is an abscess it will need I&D. If hematoma then not sure. No leuko, not febrile, neurologically intact.  [CG]    Clinical Course User Index [CG] Al Decant, PA-C [LB] Judithann Sheen, PA   Medical Decision Making Amount and/or Complexity of Data Reviewed Labs: ordered. Radiology: ordered.  Risk Prescription drug  management.   Patient signed out to me at shift change pending CT imaging.  Please see the previous provider note for further details.  In short, 27 year old male who presents to the ED for continued pain to posterior left thigh.  Was seen initially on 10/19 and subsequently twice after this.  Patient signed out to me pending imaging.  Plan per previous provider, if CT looks like an abscess will need incision and drainage however if hematoma, we will follow-up orthopedics.  Patient is not febrile, has no leukocytosis.  CT imaging shows low-density fluid collection in the posterior muscle compartment of the left thigh.  There was consideration given to multiple diagnoses such as semimembranous myotendinous junction injury with associated large hematoma.  Also abscess is a possibility however patient is not febrile, has no leukocytosis.  Reports is been ongoing for the last 1 month so would expect there to be other signs or symptoms of infection at this time.  Discussed with attending Dr. Jeraldine Loots and we favor hematoma at this time due to the above-stated reasons.  Patient will be provided with pain control and he will be referred to orthopedics for further management and care.  He was given return precautions and he voiced understanding.  He is stable to  discharge home at this time.        Al Decant, PA-C 09/15/23 1911    Gerhard Munch, MD 09/15/23 [phone]

## 2023-09-15 NOTE — Discharge Instructions (Signed)
It was a pleasure taking part in your care today.  As we discussed, you will need to follow-up with orthopedics for continued management of the suspected hematoma in the back of your left thigh.  Please call the number provided tomorrow morning and make an appointment to be seen.  Please pick up pain medication sent to your pharmacy on file.  Please return to the ED with any new or worsening symptoms such as fevers.   Large 9.9 x 8.6 x 21.0 cm low-density fluid collection in the  posterior muscle compartment of the left thigh. Given clinical  history, primary differential consideration is semimembranosus  myotendinous junction injury with associated large hematoma. Abscess  is also a possibility, although considered less likely given lack of  leukocytosis. Neoplasm complicated by hemorrhage is also considered  less likely. Consider further evaluation with MRI or ultrasound. The  collection could easily be aspirated with ultrasound guidance.      Electronically Signed

## 2023-09-15 NOTE — ED Triage Notes (Signed)
Pt reports injuring left hamstring about a month ago, pain is now worse, states "I can barely walk"

## 2023-09-15 NOTE — ED Provider Notes (Signed)
[place] EMERGENCY DEPARTMENT AT [facility] Provider Note   CSN: [id number] Arrival date & time: 09/15/23  8295     History  Chief Complaint  Patient presents with   Leg Injury    Micheal Lara is a 27 y.o. male with no significant past medical history presents to emergency department for evaluation of left thigh pain, swelling.  He had evaluation initially on 08/15/2023 for possible hamstring injury and reports that this mass has only been present for the past 2 weeks. He does not recall previous trauma or injury prior to symptoms. He reports pain worsens with ambulation and palpation of mass. He has been using a cane to aid in ambulation and stability. He reports that hamstring exercises have improved pain mildly. He denies fevers at home, numbness, paresthesia, weakness, history of clots, pedal edema, SOB, CP.  He was provided orthopedic f/u but did not schedule appointment and naprosyn prescription from ED visit on 08/21/23 with mild improvement to symptoms.  He was evaluated at [facility] on 09/06/23 and recommended to be evaluated at [facility] ED but did not seek further evaluation until today.  HPI     Home Medications Prior to Admission medications   Medication Sig Start Date End Date Taking? Authorizing Provider  naproxen (NAPROSYN) 375 MG tablet Take 1 tablet (375 mg total) by mouth 2 (two) times daily. 08/21/23   Linwood Dibbles, MD      Allergies    Patient has no known allergies.    Review of Systems   Review of Systems  Constitutional:  Negative for chills, fatigue and fever.  Respiratory:  Negative for cough, chest tightness, shortness of breath and wheezing.   Cardiovascular:  Negative for chest pain and palpitations.  Gastrointestinal:  Negative for abdominal pain, constipation, diarrhea, nausea and vomiting.  Musculoskeletal:  Positive for myalgias.  Neurological:  Negative for dizziness, seizures, weakness, light-headedness, numbness and headaches.     Physical Exam Updated Vital Signs BP 101/66 (BP Location: Left Arm)   Pulse 98   Temp 98.4 F (36.9 C) (Oral)   Resp 16   SpO2 99%  Physical Exam Vitals and nursing note reviewed.  Constitutional:      General: He is not in acute distress.    Appearance: Normal appearance.  HENT:     Head: Normocephalic and atraumatic.  Eyes:     Conjunctiva/sclera: Conjunctivae normal.  Cardiovascular:     Rate and Rhythm: Normal rate.     Pulses: Normal pulses.     Comments: Dorsalis pedis 2+ equal bilaterally Pulmonary:     Effort: Pulmonary effort is normal. No respiratory distress.  Musculoskeletal:     Right lower leg: No edema.     Left lower leg: No edema.     Comments: No TTP of BLE bilaterally besides mass to left posterior thigh Full ROM of left knee and ankle  Skin:    General: Skin is warm.     Capillary Refill: Capillary refill takes less than 2 seconds.     Coloration: Skin is not jaundiced or pale.     Comments: Tender nonfluctuant mass to left posterior thigh No pallor, warmth, erythema, fluctuance noted to BLE No swelling to knees, ankles bilaterally  Neurological:     General: No focal deficit present.     Mental Status: He is alert and oriented to person, place, and time. Mental status is at baseline.     Sensory: No sensory deficit.     Gait: Gait abnormal (  2/2 to pain).     Deep Tendon Reflexes: Reflexes normal.     Comments: Sensation equal and intact bilaterally    ED Results / Procedures / Treatments   Labs (all labs ordered are listed, but only abnormal results are displayed) Labs Reviewed  CBC WITH DIFFERENTIAL/PLATELET - Abnormal; Notable for the following components:      Result Value   RBC 3.48 (*)    Hemoglobin 10.6 (*)    HCT 31.0 (*)    All other components within normal limits  COMPREHENSIVE METABOLIC PANEL - Abnormal; Notable for the following components:   Sodium 130 (*)    Chloride 95 (*)    Calcium 8.3 (*)    Total Protein 8.4 (*)     Albumin 2.8 (*)    AST 101 (*)    ALT 88 (*)    All other components within normal limits    EKG None  Radiology No results found.  Procedures Procedures    Medications Ordered in ED Medications  lidocaine (LIDODERM) 5 % 1 patch (1 patch Transdermal Patch Applied 09/15/23 1338)  ketorolac (TORADOL) 15 MG/ML injection 15 mg (15 mg Intravenous Given 09/15/23 1032)  iohexol (OMNIPAQUE) 300 MG/ML solution 100 mL (100 mLs Intravenous Contrast Given 09/15/23 1216)    ED Course/ Medical Decision Making/ A&P Clinical Course as of 09/15/23 1516  Tue Sep 15, 2023  1245 At bedside, VS HR 98bpm, RR 16, SpO2 99% RA, T 98.30F. Reports continued pain and would like lidocaine patch to put on posterior leg [LB]  1509 Seen on 10/19 for hamstring injury with no known cause. Xray negative, DVT negative. Still complaining of left mass TTP. Did not CT. CT looks like mass. No fluctuance. If it is an abscess it will need I&D. If hematoma then not sure. No leuko, not febrile, neurologically intact.  [CG]    Clinical Course User Index [CG] Al Decant, PA-C [LB] Judithann Sheen, PA                                 Medical Decision Making Amount and/or Complexity of Data Reviewed Labs: ordered. Radiology: ordered.  Risk Prescription drug management.   Patient presents to the ED for concern of left leg swelling for past five weeks, this involves an extensive number of treatment options, and is a complaint that carries with it a high risk of complications and morbidity.  The differential diagnosis includes muscle/tendon injury/tear, cellulitis, DVT   Co morbidities that complicate the patient evaluation  None   Additional history obtained:  Additional history obtained from Nursing and Outside Medical Records   External records from outside source obtained and reviewed including EM notes from 08/15/23, 08/21/23, 09/06/23 XR from 08/15/23 negative for fracture and soft tissue  swelling, DVT study negative   Lab Tests:  I Ordered, and personally interpreted labs.  The pertinent results include:  no leukocytosis   Imaging Studies ordered:  I ordered imaging studies including CT thigh with con  Pending at sign out    Medicines ordered and prescription drug management:  I provided Toradol and lidocaine patches for pain management Following reassessment, he reports improvement of pain I have reviewed the patients home medicines and have made adjustments as needed    Problem List / ED Course:  Left leg pain   Reevaluation:  After the interventions noted above, I reevaluated the patient and found that they have :  improved   Dispostion:  Patient is resting comfortably in bed. He continues to complain of left thigh pain and swelling. VS significant for tachycardia without fever. See HPI.  PE is significant for TTP of left thigh with firm mass without fluctuance. There is no increased warmth, erythema when compared to right LE. As patient had negative XR and DVT study, will obtain CT to r/o abscess vs cellulitis vs muscle/ligamentous injury. Upon chart review, it appears that mass was present upon initial evaluation on 10/25. Patient is unsure whether it has been increasing in size but reports that it continues to hurt despite time and supportive care.  Labs significant for no leukocytosis nor anemia. Mild transaminitis with no previous to compare  CT pending upon sign out to [facility] If abscess consider drainage vs hematoma.        Final Clinical Impression(s) / ED Diagnoses Final diagnoses:  None    Rx / DC Orders ED Discharge Orders     None         Judithann Sheen, PA 09/15/23 1518    Melene Plan, DO 09/16/23 [phone]

## 2023-10-07 ENCOUNTER — Ambulatory Visit: Payer: Medicaid Other | Admitting: Pharmacist

## 2023-10-07 ENCOUNTER — Ambulatory Visit: Payer: Self-pay

## 2023-10-07 ENCOUNTER — Telehealth: Payer: Self-pay

## 2023-10-07 ENCOUNTER — Ambulatory Visit (INDEPENDENT_AMBULATORY_CARE_PROVIDER_SITE_OTHER): Payer: Self-pay | Admitting: Internal Medicine

## 2023-10-07 ENCOUNTER — Other Ambulatory Visit: Payer: Self-pay

## 2023-10-07 ENCOUNTER — Encounter: Payer: Self-pay | Admitting: Internal Medicine

## 2023-10-07 ENCOUNTER — Other Ambulatory Visit (HOSPITAL_COMMUNITY)
Admission: RE | Admit: 2023-10-07 | Discharge: 2023-10-07 | Disposition: A | Discharge status: Home / Self Care | Payer: Medicaid Other | Source: Ambulatory Visit | Attending: Internal Medicine | Admitting: Internal Medicine

## 2023-10-07 VITALS — BP 123/89 | HR 127 | Temp 97.7°F | Ht 71.0 in | Wt 159.0 lb

## 2023-10-07 DIAGNOSIS — Z111 Encounter for screening for respiratory tuberculosis: Secondary | ICD-10-CM | POA: Diagnosis not present

## 2023-10-07 DIAGNOSIS — L02416 Cutaneous abscess of left lower limb: Secondary | ICD-10-CM

## 2023-10-07 DIAGNOSIS — Z113 Encounter for screening for infections with a predominantly sexual mode of transmission: Secondary | ICD-10-CM | POA: Diagnosis not present

## 2023-10-07 DIAGNOSIS — B20 Human immunodeficiency virus [HIV] disease: Secondary | ICD-10-CM | POA: Diagnosis present

## 2023-10-07 DIAGNOSIS — R0600 Dyspnea, unspecified: Secondary | ICD-10-CM

## 2023-10-07 DIAGNOSIS — Z1159 Encounter for screening for other viral diseases: Secondary | ICD-10-CM

## 2023-10-07 DIAGNOSIS — R634 Abnormal weight loss: Secondary | ICD-10-CM

## 2023-10-07 MED ORDER — BICTEGRAVIR-EMTRICITAB-TENOFOV 50-200-25 MG PO TABS: 1.0000 | ORAL_TABLET | Freq: Every day | ORAL | 11 refills | Status: DC

## 2023-10-07 MED ORDER — BICTEGRAVIR-EMTRICITAB-TENOFOV 50-200-25 MG PO TABS
1.0000 | ORAL_TABLET | Freq: Every day | ORAL | 11 refills | Status: AC
Start: 2023-10-07 — End: 2024-10-01

## 2023-10-07 MED ORDER — SULFAMETHOXAZOLE-TRIMETHOPRIM 800-160 MG PO TABS: 2.0000 | ORAL_TABLET | Freq: Two times a day (BID) | ORAL | 1 refills | Status: AC

## 2023-10-07 NOTE — Patient Instructions (Signed)
Will get basic hiv labs today to get an idea of how long you might have had it   We can start you on bactrim DS tablet 2 tablet twice a day for the abscess now  Echo and chest xray to be arranged when you check out   See [place] in a month   Pharmacy and finance team would like to talk with you as well either today or soon

## 2023-10-07 NOTE — Telephone Encounter (Signed)
Started a PA for echocardiogram through [employer]. PA was approved until 12/06/2023 with authorization # [id number].

## 2023-10-07 NOTE — Progress Notes (Signed)
[facility]  Reason for Consult:newly dx'ed hiv Referring Provider: [facility] [phone])     There are no problems to display for this patient.     HPI: Micheal Lara is a 27 y.o. male who is referred here for newly dx'ed hiv  I reviewed outpatient note from [facility]: Patient was seen for left posterior thigh abscess. Onset a month prior to initial 09/21/23 ortho clinic visit. He had multiple ed visits prior. 09/15/23 ct scan showed 10x8.6x21 fluid collection left posterior thigh muscle compartment with ddx hematoma/neoplasm/abscess. Venous doppler negative for dvt; no leukocytosis.  Hiv/syphilis screen sent and hiv screen was positive; rpr NR but no tppa checked. The cultures (most recently 09/29/23) showed gpc in clusters from abscess aspirate. There was also CPPD crystal.  He was not given any antibiotics for the abscess. Ortho team considers extraarticular tophaceous gout as well. He was given a medrol dose pack.  He was referred to ID for evaluation prior to surgical I&D.   The fluid collection is draining at this time (since the aspiration; appears red and purulent).    He denies fever, chill  Patient had severe pain with walking, behind the thigh  He had lost 60 pounds from 225 --> 165 pounds over the last 3 months He also endorsed short of breath. No cough/chest pain.  No joint pain or back pain No rash No headache while taking nsaids for the abscess. He has had hx of migraine since age 44 no change outside of recent improvement with the nsaids   He was tested 3 years ago when he broke up with his fiancee, which was the last time he had sex. He denies street drug use  He had contacted that partner Insurance claims handler) who will plan on testing. 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 PMH: No previous medical problem    Social History   Tobacco Use   Smoking status: Never   Smokeless tobacco: Never  Vaping Use   Vaping status: Never Used  Substance Use Topics   Alcohol use: No   Drug use: No    No family history on file.  No Known Allergies  OBJECTIVE: Vitals:   10/07/23 1504  Weight: 159 lb (72.1 kg)  Height: 5\' 11"  (1.803 m)   Body mass index is 22.18 kg/m.   Physical Exam General/constitutional: no distress, pleasant HEENT: Normocephalic, PER, Conj Clear, EOMI, Oropharynx clear Neck supple CV: rrr no mrg Lungs: clear to auscultation, normal respiratory effort Abd: Soft, Nontender Ext: no edema Skin: No Rash Neuro: nonfocal MSK: left thigh wrapped and posterior thigh swelling  Lab: Lab Results  Component Value Date   WBC 4.7 09/15/2023   HGB 10.6 (L) 09/15/2023   HCT 31.0 (L) 09/15/2023   MCV 89.1 09/15/2023   PLT 221 09/15/2023   Last metabolic panel Lab Results  Component Value Date   GLUCOSE 95 09/15/2023   NA 130 (L) 09/15/2023   K 4.3 09/15/2023   CL 95 (L) 09/15/2023   CO2 27 09/15/2023   BUN 10 09/15/2023   CREATININE 0.69  09/15/2023   GFRNONAA >60 09/15/2023   CALCIUM 8.3 (L) 09/15/2023   PROT 8.4 (H) 09/15/2023   ALBUMIN 2.8 (L) 09/15/2023   BILITOT 0.3 09/15/2023   ALKPHOS 92 09/15/2023   AST 101 (H) 09/15/2023   ALT 88 (H) 09/15/2023   ANIONGAP 8 09/15/2023    Microbiology:  Serology:  Imaging: Reviewed  09/15/23 ct left femure with contrast 1. Large 9.9 x 8.6 x 21.0 cm low-density fluid collection in the posterior muscle compartment of the left thigh. Given clinical history, primary differential consideration is semimembranosus myotendinous junction injury with associated large hematoma. Abscess is also a  possibility, although considered less likely given lack of leukocytosis. Neoplasm complicated by hemorrhage is also considered less likely. Consider further evaluation with MRI or ultrasound. The collection could easily be aspirated with ultrasound guidance.   Assessment/plan: Problem List Items Addressed This Visit   None Visit Diagnoses     HIV disease (HCC)    -  Primary   Relevant Medications   bictegravir-emtricitabine-tenofovir AF (BIKTARVY) 50-200-25 MG TABS tablet   sulfamethoxazole-trimethoprim (BACTRIM DS) 800-160 MG tablet   Other Relevant Orders   HIV 1 RNA quant-no reflex-bld   HIV antibody (with reflex)   HIV RNA, RTPCR W/R GT (RTI, PI,INT)   CBC w/Diff   COMPLETE METABOLIC PANEL WITH GFR   T-helper cells (CD4) count   DG Chest 2 View   Abscess of left thigh       Relevant Orders   ECHOCARDIOGRAM COMPLETE   Screening for STDs (sexually transmitted diseases)       Relevant Orders   T.pallidum Ab, Total   Urine cytology ancillary only([place])   Need for hepatitis B screening test       Relevant Orders   Hepatitis, Acute   Need for hepatitis C screening test       Relevant Orders   Hepatitis, Acute   Screening-pulmonary TB       Weight loss       Relevant Orders   QuantiFERON-TB Gold Plus   DG Chest 2 View   Dyspnea, unspecified type             #hiv New dx 09/2023; last potential exposure sexual 2021 [facility]; no drug use  -repeat full tests today -rapid start biktarvy -pharmacy/thp/finance referral -f/u 1 month   #left thigh abscess Started deep initially no external wound ?occult bacteremia No prior abx Wound cx x2 gpc in clusters no growth (we called ortho clinic today to confirm) Likely mrsa vs mssa and will bactrim for now Will do tte -- if negative will keep on bactrim ds 2 tablet bid  Ok from id standpoint for ortho to I&D -- would need more cultures during I&D and aerobic/anaerobic cultures are sufficient  He'll f/u surgery  10/20/23   #dyspnea Previous doppler u/s left leg negative Given weight loss and hiv dx will get xray of chest   #hcm -vaccination Will review -hepatitis Acute hep panel sent 10/07/23 -std Tppa and urine gc/chlam sent 10/07/23 -tb screening Quantiferon gold 10/07/23 -cancer screening Anal pap to start at age 2    Follow-up: Return in about 2 weeks (around 10/21/2023).  Raymondo Band, MD [facility] 10/07/2023, 3:08 PM

## 2023-10-07 NOTE — Progress Notes (Signed)
   HPI: Micheal Lara is a 27 y.o. male who presents to the [facility] clinic for rapid initiation of ART for newly diagnosed HIV infection.  There are no problems to display for this patient.   Patient's Medications  New Prescriptions   SULFAMETHOXAZOLE-TRIMETHOPRIM (BACTRIM DS) 800-160 MG TABLET    Take 2 tablets by mouth 2 (two) times daily.  Previous Medications   CYCLOBENZAPRINE (FLEXERIL) 10 MG TABLET    SMARTSIG:1.0 Tablet(s) By Mouth 3 Times Daily   OXYCODONE-ACETAMINOPHEN (PERCOCET/ROXICET) 5-325 MG TABLET    Take 1 tablet by mouth every 6 (six) hours as needed for severe pain (pain score 7-10).   TRAMADOL (ULTRAM) 50 MG TABLET    SMARTSIG:1.0 Tablet(s) By Mouth Every 6 Hours PRN  Modified Medications   Modified Medication Previous Medication   BICTEGRAVIR-EMTRICITABINE-TENOFOVIR AF (BIKTARVY) 50-200-25 MG TABS TABLET bictegravir-emtricitabine-tenofovir AF (BIKTARVY) 50-200-25 MG TABS tablet      Take 1 tablet by mouth daily.    Take 1 tablet by mouth daily.  Discontinued Medications   No medications on file    Labs: No results found for: "HIV1RNAQUANT", "HIV1RNAVL", "CD4TABS"  RPR and STI No results found for: "LABRPR", "RPRTITER"      No data to display          Hepatitis B No results found for: "HEPBSAB", "HEPBSAG", "HEPBCAB" Hepatitis C No results found for: "HEPCAB", "HCVRNAPCRQN" Hepatitis A No results found for: "HAV" Lipids: No results found for: "CHOL", "TRIG", "HDL", "CHOLHDL", "VLDL", "LDLCALC"   Assessment: Micheal Lara presents to the clinic to establish care for their newly diagnosed HIV infection. No labs available yet but will get them drawn today. Medications reviewed; no drug interactions were found. Will start Biktarvy.  Explained that Micheal Lara is a one pill once daily medication with or without food and the importance of not missing any doses. Explained resistance and how it develops and why it is so important to take Biktarvy daily and not skip days or  doses. Counseled patient to take it around the same time each day. Counseled on what to do if dose is missed, if closer to missed dose take immediately, if closer to next dose then skip and resume normal schedule.   Cautioned on possible side effects the first week or so including nausea, diarrhea, dizziness, and headaches but that they should resolve after the first couple of weeks. Counseled patient to separate Biktarvy from divalent cations including multivitamins. Discussed with patient to call clinic if he starts a new medication or herbal supplement. I gave the patient my card and told him to call me with any issues/questions/concerns.  Plan: - Biktarvy rapid start - Will follow up on lab work  Micheal Lara, PharmD, BCIDP, AAHIVP, CPP Clinical Pharmacist Practitioner Infectious Diseases Clinical Pharmacist [facility] 10/07/2023, 3:52 PM

## 2023-10-09 LAB — URINE CYTOLOGY ANCILLARY ONLY
Chlamydia: NEGATIVE
Comment: NEGATIVE
Comment: NEGATIVE
Comment: NORMAL
Neisseria Gonorrhea: POSITIVE — AB
Trichomonas: NEGATIVE

## 2023-10-12 ENCOUNTER — Telehealth: Payer: Self-pay

## 2023-10-12 NOTE — Telephone Encounter (Signed)
Patient will be in for an appointment to see Dr. Renold Don 10/29/23

## 2023-10-12 NOTE — Telephone Encounter (Signed)
Patient has positive gonorrhea results.  I have spoke to the patient informed him of the results.  Advised no sex until treatment and an additional 7 days after treatment. Patient also advised to inform partner as well, so they can be tested and treated. Patient scheduled for 10/13/23 for treatment. Micheal Lara, CMA

## 2023-10-12 NOTE — Telephone Encounter (Signed)
Thanks D  Let's also have him re test at around 4 to 6 weeks after tx, for reinfection

## 2023-10-13 ENCOUNTER — Other Ambulatory Visit: Payer: Self-pay

## 2023-10-13 ENCOUNTER — Ambulatory Visit (INDEPENDENT_AMBULATORY_CARE_PROVIDER_SITE_OTHER): Payer: Medicaid Other

## 2023-10-13 DIAGNOSIS — A549 Gonococcal infection, unspecified: Secondary | ICD-10-CM | POA: Diagnosis present

## 2023-10-13 MED ORDER — CEFTRIAXONE SODIUM 500 MG IJ SOLR
500.0000 mg | Freq: Once | INTRAMUSCULAR | Status: AC
  Administered 2023-10-13: 500 mg via INTRAMUSCULAR

## 2023-10-13 NOTE — Progress Notes (Signed)
Patient in office for gonorrhea treatment with  his grandmother.  No information disclosed in front of the grandmother.  Patient declined condoms.  Patient tolerated well.  Virdia Ziesmer Jonathon Resides, CMA

## 2023-10-15 LAB — CBC WITH DIFFERENTIAL/PLATELET
Absolute Lymphocytes: 1909 {cells}/uL (ref 850–3900)
Absolute Monocytes: 857 {cells}/uL (ref 200–950)
Basophils Absolute: 38 {cells}/uL (ref 0–200)
Basophils Relative: 0.6 %
Eosinophils Absolute: 151 {cells}/uL (ref 15–500)
Eosinophils Relative: 2.4 %
HCT: 28.2 % — ABNORMAL LOW (ref 38.5–50.0)
Hemoglobin: 9.4 g/dL — ABNORMAL LOW (ref 13.2–17.1)
MCH: 29.3 pg (ref 27.0–33.0)
MCHC: 33.3 g/dL (ref 32.0–36.0)
MCV: 87.9 fL (ref 80.0–100.0)
MPV: 10.1 fL (ref 7.5–12.5)
Monocytes Relative: 13.6 %
Neutro Abs: 3345 {cells}/uL (ref 1500–7800)
Neutrophils Relative %: 53.1 %
Platelets: 449 10*3/uL — ABNORMAL HIGH (ref 140–400)
RBC: 3.21 10*6/uL — ABNORMAL LOW (ref 4.20–5.80)
RDW: 13.9 % (ref 11.0–15.0)
Total Lymphocyte: 30.3 %
WBC: 6.3 10*3/uL (ref 3.8–10.8)

## 2023-10-15 LAB — HEPATITIS PANEL, ACUTE
Hep A IgM: NONREACTIVE
Hep B C IgM: NONREACTIVE
Hepatitis B Surface Ag: NONREACTIVE
Hepatitis C Ab: NONREACTIVE

## 2023-10-15 LAB — COMPLETE METABOLIC PANEL WITH GFR
AG Ratio: 0.5 (calc) — ABNORMAL LOW (ref 1.0–2.5)
ALT: 206 U/L — ABNORMAL HIGH (ref 9–46)
AST: 181 U/L — ABNORMAL HIGH (ref 10–40)
Albumin: 2.9 g/dL — ABNORMAL LOW (ref 3.6–5.1)
Alkaline phosphatase (APISO): 187 U/L — ABNORMAL HIGH (ref 36–130)
BUN: 14 mg/dL (ref 7–25)
CO2: 26 mmol/L (ref 20–32)
Calcium: 8.4 mg/dL — ABNORMAL LOW (ref 8.6–10.3)
Chloride: 98 mmol/L (ref 98–110)
Creat: 0.64 mg/dL (ref 0.60–1.24)
Globulin: 5.9 g/dL — ABNORMAL HIGH (ref 1.9–3.7)
Glucose, Bld: 96 mg/dL (ref 65–99)
Potassium: 4.8 mmol/L (ref 3.5–5.3)
Sodium: 131 mmol/L — ABNORMAL LOW (ref 135–146)
Total Bilirubin: 0.3 mg/dL (ref 0.2–1.2)
Total Protein: 8.8 g/dL — ABNORMAL HIGH (ref 6.1–8.1)
eGFR: 132 mL/min/{1.73_m2} (ref >=60)

## 2023-10-15 LAB — HIV-1 INTEGRASE GENOTYPE

## 2023-10-15 LAB — QUANTIFERON-TB GOLD PLUS
Mitogen-NIL: 0.04 [IU]/mL
NIL: 0.03 [IU]/mL
QuantiFERON-TB Gold Plus: UNDETERMINED — AB
TB1-NIL: 0 [IU]/mL
TB2-NIL: 0.01 [IU]/mL

## 2023-10-15 LAB — HIV RNA, RTPCR W/R GT (RTI, PI,INT)
HIV 1 RNA Quant: 1030000 {copies}/mL — ABNORMAL HIGH
HIV-1 RNA Quant, Log: 6.01 {Log} — ABNORMAL HIGH

## 2023-10-15 LAB — T.PALLIDUM AB, TOTAL: T pallidum Antibodies (TP-PA): NONREACTIVE

## 2023-10-15 LAB — HIV-1/2 AB - DIFFERENTIATION
HIV-1 antibody: POSITIVE — AB
HIV-2 Ab: NEGATIVE

## 2023-10-15 LAB — HIV-1 GENOTYPE: HIV-1 Genotype: DETECTED — AB

## 2023-10-15 LAB — HIV ANTIBODY (ROUTINE TESTING W REFLEX): HIV 1&2 Ab, 4th Generation: REACTIVE — AB

## 2023-10-29 ENCOUNTER — Ambulatory Visit: Payer: Medicaid Other | Admitting: Internal Medicine

## 2023-10-29 ENCOUNTER — Telehealth: Payer: Self-pay

## 2023-10-29 NOTE — Telephone Encounter (Signed)
 Left vm regarding NO SHOW on 10/29/23 with Dr.Vu. Patient to reschedule upon call back.

## 2023-10-30 ENCOUNTER — Ambulatory Visit (HOSPITAL_COMMUNITY)
Admission: RE | Admit: 2023-10-30 | Discharge: 2023-10-30 | Disposition: A | Discharge status: Home / Self Care | Payer: Medicaid Other | Source: Ambulatory Visit | Attending: Internal Medicine | Admitting: Internal Medicine

## 2023-10-30 DIAGNOSIS — R7881 Bacteremia: Secondary | ICD-10-CM | POA: Diagnosis present

## 2023-10-30 DIAGNOSIS — B9689 Other specified bacterial agents as the cause of diseases classified elsewhere: Secondary | ICD-10-CM | POA: Insufficient documentation

## 2023-10-30 DIAGNOSIS — L02416 Cutaneous abscess of left lower limb: Secondary | ICD-10-CM | POA: Insufficient documentation

## 2023-10-30 LAB — ECHOCARDIOGRAM COMPLETE
Area-P 1/2: 5.38 cm2
Calc EF: 42.6 %
S' Lateral: 4 cm
Single Plane A2C EF: 45.1 %
Single Plane A4C EF: 33.8 %

## 2023-11-09 ENCOUNTER — Other Ambulatory Visit: Payer: Self-pay

## 2023-11-09 ENCOUNTER — Encounter: Payer: Self-pay | Admitting: Internal Medicine

## 2023-11-09 ENCOUNTER — Ambulatory Visit (INDEPENDENT_AMBULATORY_CARE_PROVIDER_SITE_OTHER): Payer: Medicaid Other | Admitting: Internal Medicine

## 2023-11-09 VITALS — BP 122/75 | HR 107 | Temp 98.2°F | Resp 16 | Ht 71.0 in | Wt 180.2 lb

## 2023-11-09 DIAGNOSIS — L02416 Cutaneous abscess of left lower limb: Secondary | ICD-10-CM

## 2023-11-09 DIAGNOSIS — B20 Human immunodeficiency virus [HIV] disease: Secondary | ICD-10-CM

## 2023-11-09 DIAGNOSIS — B9689 Other specified bacterial agents as the cause of diseases classified elsewhere: Secondary | ICD-10-CM

## 2023-11-09 NOTE — Patient Instructions (Signed)
 Stop bactrim after you finish this bottle   Let me know if leg pain/swelling recur after you stop bactrim (usually happens within 2-4 weeks)   Continue biktarvy   See me in 6 weeks for repeat hiv testing and std screening

## 2023-11-09 NOTE — Progress Notes (Signed)
 [facility]  Cc - f/u hiv    There are no active problems to display for this patient.     HPI: Micheal Lara is a 28 y.o. male who is referred here for newly dx'ed hiv  11/09/23 id clinic f/u: Patient never ended up with surgery and the drainage had come out through an skin opening/fistula. The fistula had closed and patient is walking on legs without much discomfort; back to working. He is about a 2/10 No fever, chill Gaining some weight since last visit  He is taking biktarvy; no missed dose last 4 weeks  He also is about finishing with bactrim  2 ds bid -- has a few more days left  No complaint today  One day initially with headache on biktarvy but no other side effect No r/n/v/diarrhea    ------------- Initial id clinic visit 10/07/23: I reviewed outpatient note from [facility]: Patient was seen for left posterior thigh abscess. Onset a month prior to initial 09/21/23 ortho clinic visit. He had multiple ed visits prior. 09/15/23 ct scan showed 10x8.6x21 fluid collection left posterior thigh muscle compartment with ddx hematoma/neoplasm/abscess. Venous doppler negative for dvt; no leukocytosis.  Hiv/syphilis screen sent and hiv screen was positive; rpr NR but no tppa checked. The cultures (most recently 09/29/23) showed gpc in clusters from abscess aspirate. There was also CPPD crystal.  He was not given any antibiotics for the abscess. Ortho team considers extraarticular tophaceous gout as well. He was given a medrol dose pack.  He was referred to ID for evaluation prior to surgical I&D.   The fluid collection is draining at this time (since the aspiration; appears red and purulent).    He denies fever, chill  Patient had severe pain with walking, behind the thigh  He had lost 60 pounds from 225 --> 165 pounds over the last 3 months He also endorsed short of breath. No cough/chest pain.  No joint pain or back pain No  rash No headache while taking nsaids for the abscess. He has had hx of migraine since age 1 no change outside of recent improvement with the nsaids   He was tested 3 years ago when he broke up with his fiancee, which was the last time he had sex. He denies street drug use  He had contacted that partner insurance claims handler) who will plan on testing. 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 PMH: No previous medical problem    Social History   Tobacco Use   Smoking status: Never   Smokeless tobacco: Never  Vaping Use   Vaping status: Never Used  Substance Use Topics   Alcohol use: No   Drug use: No    No family history on file.  No Known Allergies  OBJECTIVE: Vitals:   11/09/23 1042  BP: 122/75  Pulse: (!) 107  Resp: 16  Temp: 98.2 F (36.8 C)  TempSrc: Temporal  SpO2: 98%  Weight: 180 lb 3.2 oz (81.7 kg)  Height: 5' 11 (1.803 m)   Body mass index is 25.13 kg/m.   Physical Exam General/constitutional: no distress, pleasant  HEENT: Normocephalic, PER, Conj Clear, EOMI, Oropharynx clear Neck supple CV: rrr no mrg Lungs: clear to auscultation, normal respiratory effort Abd: Soft, Nontender Ext: no edema Skin: No Rash Neuro: nonfocal MSK: left thigh no swelling/tenderness  Lab: Lab Results  Component Value Date   WBC 6.3 10/07/2023   HGB 9.4 (L) 10/07/2023   HCT 28.2 (L) 10/07/2023   MCV 87.9 10/07/2023   PLT 449 (H) 10/07/2023   Last metabolic panel Lab Results  Component Value Date   GLUCOSE 96 10/07/2023   NA 131 (L) 10/07/2023   K 4.8 10/07/2023   CL 98 10/07/2023   CO2 26 10/07/2023   BUN 14 10/07/2023   CREATININE 0.64 10/07/2023   GFRNONAA >60  09/15/2023   CALCIUM 8.4 (L) 10/07/2023   PROT 8.8 (H) 10/07/2023   ALBUMIN 2.8 (L) 09/15/2023   BILITOT 0.3 10/07/2023   ALKPHOS 92 09/15/2023   AST 181 (H) 10/07/2023   ALT 206 (H) 10/07/2023   ANIONGAP 8 09/15/2023    Microbiology:  Serology:  Imaging: Reviewed  09/15/23 ct left femure with contrast 1. Large 9.9 x 8.6 x 21.0 cm low-density fluid collection in the posterior muscle compartment of the left thigh. Given clinical history, primary differential consideration is semimembranosus myotendinous junction injury with associated large hematoma. Abscess is also a possibility, although considered less likely given lack of leukocytosis. Neoplasm complicated by hemorrhage is also considered less likely. Consider further evaluation with MRI or ultrasound. The collection could easily be aspirated with ultrasound guidance.   Assessment/plan: Problem List Items Addressed This Visit   None Visit Diagnoses       HIV disease (HCC)    -  Primary   Relevant Orders   HIV 1 RNA quant-no reflex-bld   CBC w/Diff   COMPLETE METABOLIC PANEL WITH GFR   C-reactive protein     Abscess of left thigh       Relevant Orders   HIV 1 RNA quant-no reflex-bld   CBC w/Diff   COMPLETE METABOLIC PANEL WITH GFR   C-reactive protein          #hiv New dx 09/2023; last potential exposure sexual 2021 [facility]; no drug use  Started on biktarvy early 09/2023 and seems to be tolerating and doing well    -discussed u=u -encourage compliance -continue current HIV medication -labs today -f/u in 6 weeks   #left thigh abscess Started deep initially no external wound ?occult bacteremia No prior abx Wound cx x2 gpc in clusters no growth (we called ortho clinic today to confirm) Likely mrsa vs mssa and will bactrim  for now Will do tte -- if negative will keep on bactrim  ds 2 tablet bid  Ok from id standpoint for ortho to I&D -- would need more cultures during I&D and aerobic/anaerobic  cultures are sufficient  He'll f/u surgery 10/20/23  11/09/23 assessment - abscess seems to be almost complete resolved and he can just finish a few more days of the current bottle of bactrim  without refill If sx recurs off abx, go ahead and refill the bottle and take then   #gonorrhea S/p 1 dose ceftriaxone  09/2023 No sexual activity since  -will test of refection next visit    #hcm -vaccination Will review -hepatitis 10/07/23 hep b serology shows no immunity/prior infection -- will start vaccination next visit -std Syphilis testing negative 10/07/23 -tb screening Quantiferon gold 10/07/23 negative -cancer screening Anal pap to start at age 13    Follow-up: Return in about 6 weeks (around  12/21/2023).  Constance ONEIDA Passer, MD [facility] 11/09/2023, 10:58 AM

## 2023-11-12 ENCOUNTER — Telehealth: Payer: Self-pay

## 2023-11-12 LAB — CBC WITH DIFFERENTIAL/PLATELET
Absolute Lymphocytes: 1646 {cells}/uL (ref 850–3900)
Absolute Monocytes: 523 {cells}/uL (ref 200–950)
Basophils Absolute: 62 {cells}/uL (ref 0–200)
Basophils Relative: 1.3 %
Eosinophils Absolute: 293 {cells}/uL (ref 15–500)
Eosinophils Relative: 6.1 %
HCT: 27.2 % — ABNORMAL LOW (ref 38.5–50.0)
Hemoglobin: 8.9 g/dL — ABNORMAL LOW (ref 13.2–17.1)
MCH: 31.2 pg (ref 27.0–33.0)
MCHC: 32.7 g/dL (ref 32.0–36.0)
MCV: 95.4 fL (ref 80.0–100.0)
MPV: 9.7 fL (ref 7.5–12.5)
Monocytes Relative: 10.9 %
Neutro Abs: 2275 {cells}/uL (ref 1500–7800)
Neutrophils Relative %: 47.4 %
Platelets: 383 10*3/uL (ref 140–400)
RBC: 2.85 10*6/uL — ABNORMAL LOW (ref 4.20–5.80)
RDW: 16.1 % — ABNORMAL HIGH (ref 11.0–15.0)
Total Lymphocyte: 34.3 %
WBC: 4.8 10*3/uL (ref 3.8–10.8)

## 2023-11-12 LAB — COMPLETE METABOLIC PANEL WITH GFR
AG Ratio: 0.8 (calc) — ABNORMAL LOW (ref 1.0–2.5)
ALT: 26 U/L (ref 9–46)
AST: 23 U/L (ref 10–40)
Albumin: 3.7 g/dL (ref 3.6–5.1)
Alkaline phosphatase (APISO): 114 U/L (ref 36–130)
BUN: 17 mg/dL (ref 7–25)
CO2: 25 mmol/L (ref 20–32)
Calcium: 9.1 mg/dL (ref 8.6–10.3)
Chloride: 105 mmol/L (ref 98–110)
Creat: 0.7 mg/dL (ref 0.60–1.24)
Globulin: 4.6 g/dL — ABNORMAL HIGH (ref 1.9–3.7)
Glucose, Bld: 98 mg/dL (ref 65–99)
Potassium: 4.6 mmol/L (ref 3.5–5.3)
Sodium: 137 mmol/L (ref 135–146)
Total Bilirubin: 0.2 mg/dL (ref 0.2–1.2)
Total Protein: 8.3 g/dL — ABNORMAL HIGH (ref 6.1–8.1)
eGFR: 129 mL/min/{1.73_m2} (ref >=60)

## 2023-11-12 LAB — HIV-1 RNA QUANT-NO REFLEX-BLD
HIV 1 RNA Quant: 604 {copies}/mL — ABNORMAL HIGH
HIV-1 RNA Quant, Log: 2.78 {Log} — ABNORMAL HIGH

## 2023-11-12 LAB — C-REACTIVE PROTEIN: CRP: 23.2 mg/L — ABNORMAL HIGH (ref <8.0)

## 2023-11-12 NOTE — Telephone Encounter (Signed)
Called patient to relay results, no answer. Left HIPAA compliant voicemail requesting callback.   Wynell Halberg D Jakyah Bradby, RN  

## 2023-11-12 NOTE — Telephone Encounter (Signed)
-----   Message from [place] sent at 11/12/2023  1:36 PM EST ----- Hi team  Please let him know his virus is showing excellent response to the ART  He can continue as is and see me in 6 weeks as discussed previously  thanks

## 2023-11-13 NOTE — Telephone Encounter (Signed)
Second attempt to reach patient, no answer. Voicemail left with previous attempt.   Beryle Flock, RN

## 2023-11-17 NOTE — Telephone Encounter (Signed)
Third attempt to reach patient, no answer.   Latria Mccarron D Talbert Trembath, RN  

## 2023-12-18 ENCOUNTER — Other Ambulatory Visit: Payer: Self-pay

## 2023-12-18 ENCOUNTER — Ambulatory Visit (INDEPENDENT_AMBULATORY_CARE_PROVIDER_SITE_OTHER): Payer: Medicaid Other | Admitting: Internal Medicine

## 2023-12-18 ENCOUNTER — Encounter: Payer: Self-pay | Admitting: Internal Medicine

## 2023-12-18 VITALS — BP 119/82 | HR 108 | Temp 98.5°F | Ht 71.0 in | Wt 190.0 lb

## 2023-12-18 DIAGNOSIS — L02416 Cutaneous abscess of left lower limb: Secondary | ICD-10-CM | POA: Diagnosis present

## 2023-12-18 DIAGNOSIS — B20 Human immunodeficiency virus [HIV] disease: Secondary | ICD-10-CM | POA: Diagnosis not present

## 2023-12-18 DIAGNOSIS — Z23 Encounter for immunization: Secondary | ICD-10-CM | POA: Diagnosis not present

## 2023-12-18 DIAGNOSIS — Z113 Encounter for screening for infections with a predominantly sexual mode of transmission: Secondary | ICD-10-CM

## 2023-12-18 NOTE — Patient Instructions (Signed)
 Continue your biktarvy   Please see [place] in 3 months   Urine and blood tests today

## 2023-12-18 NOTE — Progress Notes (Signed)
 [facility]  Cc - f/u hiv         HPI: Micheal Lara is a 28 y.o. male with hiv here for f/u care  12/18/23 id clinic f/u: No complaint today Friend in hospital with hypothermia No missed dose biktarvy   11/09/23 id clinic f/u: Patient never ended up with surgery and the drainage had come out through an skin opening/fistula. The fistula had closed and patient is walking on legs without much discomfort; back to working. He is about a 2/10 No fever, chill Gaining some weight since last visit  He is taking biktarvy; no missed dose last 4 weeks  He also is about finishing with bactrim 2 ds bid -- has a few more days left  No complaint today  One day initially with headache on biktarvy but no other side effect No r/n/v/diarrhea    ------------- Initial id clinic visit 10/07/23: I reviewed outpatient note from [facility]: Patient was seen for left posterior thigh abscess. Onset a month prior to initial 09/21/23 ortho clinic visit. He had multiple ed visits prior. 09/15/23 ct scan showed 10x8.6x21 fluid collection left posterior thigh muscle compartment with ddx hematoma/neoplasm/abscess. Venous doppler negative for dvt; no leukocytosis.  Hiv/syphilis screen sent and hiv screen was positive; rpr NR but no tppa checked. The cultures (most recently 09/29/23) showed gpc in clusters from abscess aspirate. There was also CPPD crystal.  He was not given any antibiotics for the abscess. Ortho team considers extraarticular tophaceous gout as well. He was given a medrol dose pack.  He was referred to ID for evaluation prior to surgical I&D.   The fluid collection is draining at this time (since the aspiration; appears red and purulent).    He denies fever, chill  Patient had severe pain with walking, behind the thigh  He had lost 60 pounds from 225 --> 165 pounds over the last 3 months He also endorsed short of breath. No cough/chest  pain.  No joint pain or back pain No rash No headache while taking nsaids for the abscess. He has had hx of migraine since age 28 no change outside of recent improvement with the nsaids   He was tested 3 years ago when he broke up with his fiancee, which was the last time he had sex. He denies street drug use  He had contacted that partner Insurance claims handler) who will plan on testing. 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 PMH: No previous medical problem    Social History   Tobacco Use   Smoking status: Never   Smokeless tobacco: Never  Vaping Use   Vaping status: Never Used  Substance Use Topics   Alcohol use: No   Drug use: No    No family history on file.  No Known Allergies  OBJECTIVE: Vitals:   12/18/23 1054  BP: 119/82  Pulse: (!) 108  Temp: 98.5 F (36.9 C)  TempSrc: Temporal  SpO2: 97%  Weight: 190 lb (86.2 kg)  Height: 5\' 11"  (1.803 m)   Body mass index is 26.5 kg/m.   Physical Exam General/constitutional: no  distress, pleasant HEENT: Normocephalic, PER, Conj Clear, EOMI, Oropharynx clear Neck supple CV: rrr no mrg Lungs: clear to auscultation, normal respiratory effort Abd: Soft, Nontender Ext: no edema Skin: No Rash Neuro: nonfocal MSK: left thigh no swelling/tenderness  Lab: Lab Results  Component Value Date   WBC 4.8 11/09/2023   HGB 8.9 (L) 11/09/2023   HCT 27.2 (L) 11/09/2023   MCV 95.4 11/09/2023   PLT 383 11/09/2023   Last metabolic panel Lab Results  Component Value Date   GLUCOSE 98 11/09/2023   NA 137 11/09/2023   K 4.6 11/09/2023   CL 105 11/09/2023   CO2 25 11/09/2023   BUN 17 11/09/2023   CREATININE 0.70 11/09/2023    GFRNONAA >60 09/15/2023   CALCIUM 9.1 11/09/2023   PROT 8.3 (H) 11/09/2023   ALBUMIN 2.8 (L) 09/15/2023   BILITOT 0.2 11/09/2023   ALKPHOS 92 09/15/2023   AST 23 11/09/2023   ALT 26 11/09/2023   ANIONGAP 8 09/15/2023   HIV: 10/2023      604  09/2023      1.03 million  /    Microbiology:  Serology:  Imaging: Reviewed  09/15/23 ct left femure with contrast 1. Large 9.9 x 8.6 x 21.0 cm low-density fluid collection in the posterior muscle compartment of the left thigh. Given clinical history, primary differential consideration is semimembranosus myotendinous junction injury with associated large hematoma. Abscess is also a possibility, although considered less likely given lack of leukocytosis. Neoplasm complicated by hemorrhage is also considered less likely. Consider further evaluation with MRI or ultrasound. The collection could easily be aspirated with ultrasound guidance.   Assessment/plan: Problem List Items Addressed This Visit   None Visit Diagnoses       Abscess of left thigh    -  Primary   Relevant Orders   C-reactive protein     HIV disease (HCC)       Relevant Orders   T-helper cells (CD4) count   HIV 1 RNA quant-no reflex-bld   CBC   COMPLETE METABOLIC PANEL WITH GFR     Screening for STDs (sexually transmitted diseases)       Relevant Orders   RPR   C. trachomatis/N. gonorrhoeae RNA   Trichomonas vaginalis, RNA     Need for hepatitis B vaccination               #hiv New dx 09/2023; last potential exposure sexual 2021 Msm; no drug use  Started on biktarvy early 09/2023 and seems to be tolerating and doing well  12/18/23 tolerating biktarvy; no missed dose last 4 weeks  -discussed u=u -encourage compliance -continue current HIV medication -labs today -3 months follow    #left thigh abscess Started deep initially no external wound ?occult bacteremia No prior abx Wound cx x2 gpc in clusters no growth (we called ortho clinic today to  confirm) Likely mrsa vs mssa and will bactrim for now 10/30/23 tte negative Finished bactrim around mid 2025 No surgery ever needed    #gonorrhea S/p 1 dose ceftriaxone 09/2023 No sexual activity since  -will do test of reinfection today; he said he remains not sexually active     #hcm -vaccination Will review -hepatitis 10/07/23 hep b serology shows no immunity/prior infection 09/2023 hep c ab negative  12/18/23 heplisave first of 2 or 3 shots -std Syphilis testing negative 10/07/23 Gonorrhea 09/2023 s/p treatment -tb screening Quantiferon gold 10/07/23 negative -cancer screening Anal pap to start at age 56    Follow-up:  Return in about 3 months (around 03/16/2024).  Raymondo Band, MD [facility] 12/18/2023, 11:01 AM

## 2023-12-18 NOTE — Addendum Note (Signed)
 Addended by: Philippa Chester on: 12/18/2023 12:07 PM   Modules accepted: Orders

## 2023-12-19 LAB — TRICHOMONAS VAGINALIS, PROBE AMP: Trichomonas vaginalis RNA: NOT DETECTED

## 2023-12-19 LAB — C. TRACHOMATIS/N. GONORRHOEAE RNA
C. trachomatis RNA, TMA: NOT DETECTED
N. gonorrhoeae RNA, TMA: NOT DETECTED

## 2023-12-22 LAB — CBC
HCT: 39 % (ref 38.5–50.0)
Hemoglobin: 12.9 g/dL — ABNORMAL LOW (ref 13.2–17.1)
MCH: 31.2 pg (ref 27.0–33.0)
MCHC: 33.1 g/dL (ref 32.0–36.0)
MCV: 94.2 fL (ref 80.0–100.0)
MPV: 9.5 fL (ref 7.5–12.5)
Platelets: 340 Thousand/uL (ref 140–400)
RBC: 4.14 Million/uL — ABNORMAL LOW (ref 4.20–5.80)
RDW: 14 % (ref 11.0–15.0)
WBC: 3.1 Thousand/uL — ABNORMAL LOW (ref 3.8–10.8)

## 2023-12-22 LAB — COMPLETE METABOLIC PANEL WITHOUT GFR
AG Ratio: 0.8 (calc) — ABNORMAL LOW (ref 1.0–2.5)
ALT: 22 U/L (ref 9–46)
AST: 19 U/L (ref 10–40)
Albumin: 4.4 g/dL (ref 3.6–5.1)
Alkaline phosphatase (APISO): 92 U/L (ref 36–130)
BUN: 18 mg/dL (ref 7–25)
CO2: 27 mmol/L (ref 20–32)
Calcium: 9.9 mg/dL (ref 8.6–10.3)
Chloride: 102 mmol/L (ref 98–110)
Creat: 0.91 mg/dL (ref 0.60–1.24)
Globulin: 5.2 g/dL — ABNORMAL HIGH (ref 1.9–3.7)
Glucose, Bld: 83 mg/dL (ref 65–99)
Potassium: 4.3 mmol/L (ref 3.5–5.3)
Sodium: 138 mmol/L (ref 135–146)
Total Bilirubin: 0.2 mg/dL (ref 0.2–1.2)
Total Protein: 9.6 g/dL — ABNORMAL HIGH (ref 6.1–8.1)
eGFR: 118 mL/min/1.73m2 (ref >=60)

## 2023-12-22 LAB — HIV-1 RNA QUANT-NO REFLEX-BLD
HIV 1 RNA Quant: 631 {copies}/mL — ABNORMAL HIGH
HIV-1 RNA Quant, Log: 2.8 {Log_copies}/mL — ABNORMAL HIGH

## 2023-12-22 LAB — T-HELPER CELLS (CD4) COUNT (NOT AT ARMC)
Absolute CD4: 261 {cells}/uL — ABNORMAL LOW (ref 490–1740)
CD4 T Helper %: 17 % — ABNORMAL LOW (ref 30–61)
Total lymphocyte count: 1542 {cells}/uL (ref 850–3900)

## 2023-12-22 LAB — SYPHILIS: RPR W/REFLEX TO RPR TITER AND TREPONEMAL ANTIBODIES, TRADITIONAL SCREENING AND DIAGNOSIS ALGORITHM: RPR Ser Ql: NONREACTIVE

## 2023-12-22 LAB — C-REACTIVE PROTEIN: CRP: 6.7 mg/L (ref <8.0)

## 2023-12-24 ENCOUNTER — Telehealth: Payer: Self-pay

## 2023-12-24 DIAGNOSIS — B20 Human immunodeficiency virus [HIV] disease: Secondary | ICD-10-CM

## 2023-12-24 NOTE — Telephone Encounter (Signed)
 Detectable Viral Load Intervention (DVL)  Most recent VL:  HIV 1 RNA Quant  Date Value Ref Range Status  12/18/2023 631 (H) Copies/mL Final  11/09/2023 604 (H) Copies/mL Final  10/07/2023 1,030,000 (H) copies/mL Final    Last Clinic Visit: 12/18/23  Current ART regimen: Biktarvy  Appointment status: patient has future appointment scheduled  Medication last dispensed (per chart review):   Dispensed Days Supply Quantity Provider Pharmacy  BIKTARVY 50-200-25MG  TAB 12/03/2023 30 30 each Vu, Tonita Phoenix, MD [facility] [phone] ...  BIKTARVY 50-200-25MG  TAB 10/30/2023 30 30 each Vu, Tonita Phoenix, MD [facility] [phone] ...  BIKTARVY 50-200-25MG  TAB 10/07/2023 30 30 each Vu, Tonita Phoenix, MD [facility] [phone] ...   Medication Adherence   Reports no issues with adherence. States he may have missed one or two doses overall, but not consistently.     Interventions   Called patient to discuss medication adherence and possible barriers to care.   Next appointment not until June, scheduled for lab visit to recheck VL in 2 weeks.   Sandie Ano, RN

## 2024-01-07 ENCOUNTER — Other Ambulatory Visit: Payer: Medicaid Other

## 2024-01-21 ENCOUNTER — Telehealth: Payer: Self-pay

## 2024-01-21 NOTE — Telephone Encounter (Signed)
 Detectable Viral Load Intervention (DVL)  Most recent VL:  HIV 1 RNA Quant  Date Value Ref Range Status  12/18/2023 631 (H) Copies/mL Final  11/09/2023 604 (H) Copies/mL Final  10/07/2023 1,030,000 (H) copies/mL Final    Last Clinic Visit: 12/18/23  Current ART regimen: Biktarvy  Appointment status: patient has future appointment scheduled  Medication last dispensed (per chart review):   Dispensed Days Supply Quantity Provider Pharmacy  BIKTARVY 50-200-25 MG TABLET 01/04/2024 30 30 tablet Vu, Tonita Phoenix, MD [facility] [phone] ...  BIKTARVY 50-200-25MG  TAB 12/03/2023 30 30 each Vu, Tonita Phoenix, MD [facility] [phone] ...  BIKTARVY 50-200-25MG  TAB 10/30/2023 30 30 each Vu, Tonita Phoenix, MD [facility] [phone] ...    Medication Adherence  Not able to assess    Barriers to Care   Not able to assess    Interventions   Called patient to discuss medication adherence and possible barriers to care. Not able to reach patient at this time. Called [facility] pharmacy and was provided his mothers number [phone]. She informed me that patient is currently staying at his grandmother's. Left vm with her number requesting pt call back. Pt will need to reschedule lab appt.  Juanita Laster, RMA

## 2024-04-04 ENCOUNTER — Telehealth: Payer: Self-pay

## 2024-04-04 NOTE — Telephone Encounter (Signed)
 Detectable Viral Load Intervention (DVL)  Most recent VL:  HIV 1 RNA Quant  Date Value Ref Range Status  12/18/2023 631 (H) Copies/mL Final  11/09/2023 604 (H) Copies/mL Final  10/07/2023 1,030,000 (H) copies/mL Final    Last Clinic Visit: 12/18/23  Current ART regimen: Biktarvy  Appointment status: patient has future appointment scheduled  Medication last dispensed (per chart review):    Dispensed Days Supply Quantity Provider Pharmacy  BIKTARVY 50-200-25 MG TABLET 03/31/2024 30 30 tablet Vu, Martie Slaughter, MD [facility] [phone] ...  BIKTARVY 50-200-25MG  TAB 02/25/2024 30 30 each Vu, Martie Slaughter, MD [facility] [phone] ...  BIKTARVY 50-200-25 MG TABLET 01/04/2024 30 30 tablet Vu, Martie Slaughter, MD [facility] [phone] ...  BIKTARVY 50-200-25MG  TAB 12/03/2023 30 30 each Vu, Martie Slaughter, MD [facility] [phone] ...  BIKTARVY 50-200-25MG  TAB 10/30/2023 30 30 each Vu, Martie Slaughter, MD [facility] [phone] ...  BIKTARVY 50-200-25MG  TAB 10/07/2023 30 30 each Vu, Martie Slaughter, MD [facility] [phone] ...   Medication Adherence   Not able to assess    Barriers to Care   Not able to assess   Interventions   Called patient to discuss medication adherence and possible barriers to care.

## 2024-04-05 ENCOUNTER — Encounter: Payer: Self-pay | Admitting: Internal Medicine

## 2024-04-05 ENCOUNTER — Other Ambulatory Visit: Payer: Self-pay

## 2024-04-05 ENCOUNTER — Ambulatory Visit (INDEPENDENT_AMBULATORY_CARE_PROVIDER_SITE_OTHER): Payer: Medicaid Other | Admitting: Internal Medicine

## 2024-04-05 VITALS — BP 113/77 | HR 67 | Resp 16 | Ht 71.0 in | Wt 187.0 lb

## 2024-04-05 DIAGNOSIS — Z23 Encounter for immunization: Secondary | ICD-10-CM | POA: Diagnosis not present

## 2024-04-05 DIAGNOSIS — B20 Human immunodeficiency virus [HIV] disease: Secondary | ICD-10-CM

## 2024-04-05 DIAGNOSIS — Z113 Encounter for screening for infections with a predominantly sexual mode of transmission: Secondary | ICD-10-CM

## 2024-04-05 NOTE — Patient Instructions (Signed)
 Vaccine today: Pneumonia Hepatitis b (last shot in series)   Blood test Urine, throat/rectal swab today    Please make follow up appointment 6 months from now; if your viral load testing is still not better, will call you back sooner

## 2024-04-05 NOTE — Addendum Note (Signed)
 Addended by: Martavia Tye A on: 04/05/2024 11:44 AM   Modules accepted: Orders

## 2024-04-05 NOTE — Progress Notes (Signed)
 [facility]  Cc - f/u hiv         HPI: Micheal Lara is a 28 y.o. 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 The fistula had closed and patient is walking on legs without much discomfort; back to working. He is about a 2/10 No fever, chill Gaining some weight since last visit  He is taking biktarvy; no missed dose last 4 weeks  He also is about finishing with bactrim  2 ds bid -- has a few more days left  No complaint today  One day initially with headache on biktarvy but no other side effect No r/n/v/diarrhea    ------------- Initial id clinic visit 10/07/23: I reviewed outpatient note from [facility]: Patient was seen for left posterior thigh abscess. Onset a month prior to initial 09/21/23 ortho clinic visit. He had multiple ed visits prior. 09/15/23 ct scan showed 10x8.6x21 fluid collection left posterior thigh muscle compartment with ddx hematoma/neoplasm/abscess. Venous doppler negative for dvt; no leukocytosis.  Hiv/syphilis screen sent and hiv screen was positive; rpr NR but no tppa checked. The cultures (most recently 09/29/23) showed gpc in clusters from abscess aspirate. There was also CPPD crystal.  He was not given any antibiotics for the abscess. Ortho team considers extraarticular tophaceous gout as well. He was given a medrol dose pack.  He was referred to ID for evaluation prior to surgical I&D.   The fluid collection is draining at this time (since the aspiration; appears red and purulent).    He denies fever, chill  Patient had severe pain with walking, behind the  thigh  He had lost 60 pounds from 225 --> 165 pounds over the last 3 months He also endorsed short of breath. No cough/chest pain.  No joint pain or back pain No rash No headache while taking nsaids for the abscess. He has had hx of migraine since age 71 no change outside of recent improvement with the nsaids   He was tested 3 years ago when he broke up with his fiancee, which was the last time he had sex. He denies street drug use  He had contacted that partner Insurance claims handler) who will plan on testing. 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 No pertinent past medical history. PMH: No previous medical problem    Social History   Tobacco Use   Smoking status: Never   Smokeless tobacco: Never  Vaping Use   Vaping status: Never Used  Substance Use Topics   Alcohol use: No   Drug use: No    History reviewed. No pertinent family history.  No Known Allergies  OBJECTIVE: Vitals:   04/05/24 1104  Resp: 16  Height: 5\' 11"  (1.803 m)   Body mass index is 26.5 kg/m.   Physical  Exam General/constitutional: no distress, pleasant HEENT: Normocephalic, PER, Conj Clear, EOMI, Oropharynx clear Neck supple CV: rrr no mrg Lungs: clear to auscultation, normal respiratory effort Abd: Soft, Nontender Ext: no edema Skin: No Rash Neuro: nonfocal MSK: left thigh no swelling/tenderness  Lab: Lab Results  Component Value Date   WBC 3.1 (L) 12/18/2023   HGB 12.9 (L) 12/18/2023   HCT 39.0 12/18/2023   MCV 94.2 12/18/2023   PLT 340 12/18/2023   Last metabolic panel Lab Results  Component Value Date   GLUCOSE 83 12/18/2023   NA 138 12/18/2023   K 4.3 12/18/2023   CL 102 12/18/2023   CO2 27  12/18/2023   BUN 18 12/18/2023   CREATININE 0.91 12/18/2023   GFRNONAA >60 09/15/2023   CALCIUM 9.9 12/18/2023   PROT 9.6 (H) 12/18/2023   ALBUMIN 2.8 (L) 09/15/2023   BILITOT 0.2 12/18/2023   ALKPHOS 92 09/15/2023   AST 19 12/18/2023   ALT 22 12/18/2023   ANIONGAP 8 09/15/2023   HIV: 12/18/23     631     /    261 (17%) 10/2023      604  09/2023      1.03 million  /    Microbiology:  Serology:  Imaging: Reviewed  09/15/23 ct left femure with contrast 1. Large 9.9 x 8.6 x 21.0 cm low-density fluid collection in the posterior muscle compartment of the left thigh. Given clinical history, primary differential consideration is semimembranosus myotendinous junction injury with associated large hematoma. Abscess is also a possibility, although considered less likely given lack of leukocytosis. Neoplasm complicated by hemorrhage is also considered less likely. Consider further evaluation with MRI or ultrasound. The collection could easily be aspirated with ultrasound guidance.   Assessment/plan: Problem List Items Addressed This Visit   None Visit Diagnoses       HIV disease (HCC)    -  Primary   Relevant Orders   HIV 1 RNA quant-no reflex-bld     Screening for STDs (sexually transmitted diseases)       Relevant Orders   C. trachomatis/N. gonorrhoeae RNA   GC/CT Probe, Amp (Throat)   CT/NG RNA, TMA Rectal   RPR     Need for hepatitis B vaccination       Relevant Orders   Hepatitis B surface antibody,quantitative     Need for pneumococcal vaccination                #hiv New dx 09/2023; last potential exposure sexual 2021 [facility]; no drug use Therapy: Started on biktarvy early 09/2023   04/05/24 tolerating biktarvy; no missed dose last 4 weeks B/w 11/09/23 and 12/18/23 viral load is still 600s thus this early visit  -discussed u=u -encourage compliance -continue current HIV medication -labs today -6 months follow up; sooner with hiv genotype if viral load  still >200      #hcm -vaccination Prevnar 04/05/24 Tdap next visit Meningococcal will do in next few visits -hepatitis 10/07/23 hep b serology shows no immunity/prior infection 09/2023 hep c ab negative  12/18/23 heplisave first of 2 or 3 shots; 2nd shot today 04/05/24; hep b sAb quant today as well -std Syphilis testing negative 10/07/23 Gonorrhea 09/2023 s/p treatment Repeat rpr/triple screen today -tb screening Quantiferon gold 10/07/23 negative -cancer screening Anal pap to start at age 26    Follow-up: Return in about 6 months (around 10/05/2024).  Jamesetta Mcbride, MD [facility] 04/05/2024, 11:05 AM

## 2024-04-06 LAB — GC/CHLAMYDIA PROBE, AMP (THROAT)
Chlamydia trachomatis RNA: NOT DETECTED
Neisseria gonorrhoeae RNA: NOT DETECTED

## 2024-04-06 LAB — C. TRACHOMATIS/N. GONORRHOEAE RNA
C. trachomatis RNA, TMA: NOT DETECTED
N. gonorrhoeae RNA, TMA: NOT DETECTED

## 2024-04-06 LAB — CT/NG RNA, TMA RECTAL
Chlamydia Trachomatis RNA: NOT DETECTED
Neisseria Gonorrhoeae RNA: NOT DETECTED

## 2024-04-07 ENCOUNTER — Ambulatory Visit: Payer: Self-pay | Admitting: Internal Medicine

## 2024-04-07 LAB — HIV-1 RNA QUANT-NO REFLEX-BLD
HIV 1 RNA Quant: 65 {copies}/mL — ABNORMAL HIGH
HIV-1 RNA Quant, Log: 1.81 {Log_copies}/mL — ABNORMAL HIGH

## 2024-04-07 LAB — RPR: RPR Ser Ql: NONREACTIVE

## 2024-04-07 LAB — HEPATITIS B SURFACE ANTIBODY, QUANTITATIVE: Hep B S AB Quant (Post): 60 m[IU]/mL (ref >=10)

## 2024-04-07 NOTE — Telephone Encounter (Signed)
 Spoke with patient, relayed that viral load now undetectable and encouraged continued adherence to ART. Reminded him of follow up in December. Patient verbalized understanding and has no further questions.   Nashia Remus, BSN, RN

## 2024-10-05 ENCOUNTER — Ambulatory Visit: Admitting: Internal Medicine

## 2024-10-05 ENCOUNTER — Encounter: Payer: Self-pay | Admitting: Internal Medicine

## 2024-10-05 ENCOUNTER — Other Ambulatory Visit: Payer: Self-pay

## 2024-10-05 VITALS — BP 122/76 | HR 72 | Temp 98.8°F | Wt 222.0 lb

## 2024-10-05 DIAGNOSIS — Z79899 Other long term (current) drug therapy: Secondary | ICD-10-CM

## 2024-10-05 DIAGNOSIS — Z23 Encounter for immunization: Secondary | ICD-10-CM

## 2024-10-05 DIAGNOSIS — B20 Human immunodeficiency virus [HIV] disease: Secondary | ICD-10-CM

## 2024-10-05 MED ORDER — BICTEGRAVIR-EMTRICITAB-TENOFOV 50-200-25 MG PO TABS: 1.0000 | ORAL_TABLET | Freq: Every day | ORAL | 11 refills | Status: DC

## 2024-10-05 MED ORDER — BICTEGRAVIR-EMTRICITAB-TENOFOV 50-200-25 MG PO TABS: 1.0000 | ORAL_TABLET | Freq: Every day | ORAL | 11 refills | Status: AC

## 2024-10-05 NOTE — Patient Instructions (Signed)
 Your number is looking better  Make sure if you can take biktarvy every day; goal is <20 viral load measurement. Yours was 65 last June   Let's check in again in 1 year   [place] is renewed

## 2024-10-05 NOTE — Progress Notes (Signed)
 [facility]  Cc - f/u hiv         HPI: Micheal Lara is a 28 y.o. male with hiv here for f/u care   10/05/24 id clinic f/u See a&p  04/05/24 id clinic f/u Routine f/u today Gaining weight No concern Not sexually active but ok with checking std See a&p   12/18/23 id clinic f/u: No complaint today Friend in hospital with hypothermia No missed dose biktarvy   11/09/23 id clinic f/u: Patient never ended up with surgery and the drainage had come out through an skin opening/fistula. The fistula had closed and patient is walking on legs without much discomfort; back to working. He is about a 2/10 No fever, chill Gaining some weight since last visit  He is taking biktarvy; no missed dose last 4 weeks  He also is about finishing with bactrim  2 ds bid -- has a few more days left  No complaint today  One day initially with headache on biktarvy but no other side effect No r/n/v/diarrhea    ------------- Initial id clinic visit 10/07/23: I reviewed outpatient note from [facility]: Patient was seen for left posterior thigh abscess. Onset a month prior to initial 09/21/23 ortho clinic visit. He had multiple ed visits prior. 09/15/23 ct scan showed 10x8.6x21 fluid collection left posterior thigh muscle compartment with ddx hematoma/neoplasm/abscess. Venous doppler negative for dvt; no leukocytosis.  Hiv/syphilis screen sent and hiv screen was positive; rpr NR but no tppa checked. The cultures (most recently 09/29/23) showed gpc in clusters from abscess aspirate. There was also CPPD crystal.  He was not given any antibiotics for the abscess. Ortho team considers extraarticular tophaceous gout as well. He was given a medrol dose pack.  He was referred to ID for evaluation prior to surgical I&D.   The fluid collection is draining at this time (since the aspiration; appears red and purulent).    He denies fever, chill  Patient had  severe pain with walking, behind the thigh  He had lost 60 pounds from 225 --> 165 pounds over the last 3 months He also endorsed short of breath. No cough/chest pain.  No joint pain or back pain No rash No headache while taking nsaids for the abscess. He has had hx of migraine since age 50 no change outside of recent improvement with the nsaids   He was tested 3 years ago when he broke up with his fiancee, which was the last time he had sex. He denies street drug use  He had contacted that partner insurance claims handler) who will plan on testing. 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 PMH: No previous medical problem    Social History   Tobacco Use   Smoking status: Never   Smokeless tobacco: Never  Vaping Use   Vaping status: Never Used  Substance Use Topics   Alcohol use: No   Drug use: No    No family history on file.  No Known Allergies  OBJECTIVE: Vitals:   10/05/24 1055  Weight: 222 lb (100.7 kg)   Body mass index is 30.96 kg/m.  Physical Exam General/constitutional: no distress, pleasant HEENT: Normocephalic, PER, Conj Clear, EOMI, Oropharynx clear Neck supple CV: rrr no mrg Lungs: clear to auscultation, normal respiratory effort Abd: Soft, Nontender Ext: no edema Skin: No Rash Neuro: nonfocal MSK: left thigh no swelling/tenderness  Lab: Lab Results  Component Value Date   WBC 3.1 (L) 12/18/2023   HGB 12.9 (L) 12/18/2023   HCT 39.0 12/18/2023   MCV 94.2 12/18/2023   PLT 340 12/18/2023   Last metabolic panel Lab Results  Component Value Date   GLUCOSE 83 12/18/2023   NA 138 12/18/2023   K 4.3 12/18/2023   CL 102 12/18/2023   CO2 27 12/18/2023   BUN  18 12/18/2023   CREATININE 0.91 12/18/2023   GFRNONAA >60 09/15/2023   CALCIUM 9.9 12/18/2023   PROT 9.6 (H) 12/18/2023   ALBUMIN 2.8 (L) 09/15/2023   BILITOT 0.2 12/18/2023   ALKPHOS 92 09/15/2023   AST 19 12/18/2023   ALT 22 12/18/2023   ANIONGAP 8 09/15/2023   HIV: 12/18/23     631     /    261 (17%) 10/2023      604  09/2023      1.03 million  /   Lab Results  Component Value Date   HIV1RNAQUANT 65 (H) 04/05/2024   Lab Results  Component Value Date   CD4TCELL 17 (L) 12/18/2023  Cd4 261   Microbiology:  Serology:  Imaging: Reviewed  09/15/23 ct left femure with contrast 1. Large 9.9 x 8.6 x 21.0 cm low-density fluid collection in the posterior muscle compartment of the left thigh. Given clinical history, primary differential consideration is semimembranosus myotendinous junction injury with associated large hematoma. Abscess is also a possibility, although considered less likely given lack of leukocytosis. Neoplasm complicated by hemorrhage is also considered less likely. Consider further evaluation with MRI or ultrasound. The collection could easily be aspirated with ultrasound guidance.   Assessment/plan: Problem List Items Addressed This Visit   None         #hiv New dx 09/2023; last potential exposure sexual 2021 [facility]; no drug use Therapy: Started on biktarvy early 09/2023   04/05/24 tolerating biktarvy; no missed dose last 4 weeks B/w 11/09/23 and 12/18/23 viral load is still 600s thus this early visit   10/05/24 last viral load 65 in June 2025. A few mised doses here and there Wants to continue biktarvy   -discussed u=u -encourage compliance -continue current HIV medication biktarvy -labs today -f/u 12 months       #hcm -vaccination Prevnar 04/05/24 Tdap next visit Meningococcal will do in next few visits -hepatitis 10/07/23 hep b serology shows no immunity/prior infection 09/2023 hep c ab negative  12/18/23 heplisave first of  2 or 3 shots; 2nd shot 04/05/24; hep b sAb quant 03/2024 60 -std 10/05/24 not sexually active patient ask to defer; last rpr/triple screen 03/2024 negative -tb screening Quantiferon gold 10/07/23 negative -cancer screening Anal pap to start at age 15    Follow-up: Return in about 1 year (around 10/05/2025).  Constance ONEIDA Passer, MD [facility] 10/05/2024, 10:58 AM

## 2024-10-05 NOTE — Addendum Note (Signed)
 Addended by: Tawn Fitzner M on: 10/05/2024 11:25 AM   Modules accepted: Orders

## 2024-10-06 LAB — T-HELPER CELLS (CD4) COUNT (NOT AT ARMC)
CD4 % Helper T Cell: 28 % — ABNORMAL LOW (ref 33–65)
CD4 T Cell Abs: 479 /uL (ref 400–1790)

## 2024-10-07 LAB — HIV-1 RNA QUANT-NO REFLEX-BLD
HIV 1 RNA Quant: 83 {copies}/mL — ABNORMAL HIGH
HIV-1 RNA Quant, Log: 1.92 {Log_copies}/mL — ABNORMAL HIGH

## 2024-10-12 ENCOUNTER — Ambulatory Visit: Payer: Self-pay | Admitting: Internal Medicine

## 2025-10-05 ENCOUNTER — Ambulatory Visit: Admitting: Internal Medicine
